# Patient Record
Sex: Male | Born: 1960
Health system: Southern US, Community
[De-identification: ages and names within clinical notes are randomized; demographics above are authoritative.]

## PROBLEM LIST (undated history)

## (undated) DIAGNOSIS — Z8249 Family history of ischemic heart disease and other diseases of the circulatory system: Secondary | ICD-10-CM

## (undated) DIAGNOSIS — E785 Hyperlipidemia, unspecified: Secondary | ICD-10-CM

## (undated) DIAGNOSIS — I10 Essential (primary) hypertension: Secondary | ICD-10-CM

## (undated) DIAGNOSIS — I5189 Other ill-defined heart diseases: Secondary | ICD-10-CM

## (undated) DIAGNOSIS — T7840XA Allergy, unspecified, initial encounter: Secondary | ICD-10-CM

## (undated) DIAGNOSIS — I2510 Atherosclerotic heart disease of native coronary artery without angina pectoris: Secondary | ICD-10-CM

## (undated) HISTORY — DX: Other ill-defined heart diseases: I51.89

## (undated) HISTORY — DX: Family history of ischemic heart disease and other diseases of the circulatory system: Z82.49

## (undated) HISTORY — DX: Essential (primary) hypertension: I10

## (undated) HISTORY — DX: Allergy, unspecified, initial encounter: T78.40XA

## (undated) HISTORY — DX: Atherosclerotic heart disease of native coronary artery without angina pectoris: I25.10

## (undated) HISTORY — PX: OTHER SURGICAL HISTORY: SHX169

## (undated) HISTORY — DX: Hyperlipidemia, unspecified: E78.5

---

## 2008-10-21 ENCOUNTER — Emergency Department: Payer: Self-pay | Admitting: Emergency Medicine

## 2013-06-18 ENCOUNTER — Emergency Department: Payer: Self-pay | Admitting: Emergency Medicine

## 2015-06-30 ENCOUNTER — Ambulatory Visit
Admission: RE | Admit: 2015-06-30 | Discharge: 2015-06-30 | Disposition: A | Discharge status: Home / Self Care | Payer: BLUE CROSS/BLUE SHIELD | Source: Ambulatory Visit | Attending: Family Medicine | Admitting: Family Medicine

## 2015-06-30 ENCOUNTER — Other Ambulatory Visit: Payer: Self-pay | Admitting: Family Medicine

## 2015-06-30 DIAGNOSIS — M25362 Other instability, left knee: Secondary | ICD-10-CM | POA: Diagnosis not present

## 2015-07-09 DIAGNOSIS — M25362 Other instability, left knee: Secondary | ICD-10-CM | POA: Diagnosis not present

## 2015-12-09 DIAGNOSIS — Z1211 Encounter for screening for malignant neoplasm of colon: Secondary | ICD-10-CM | POA: Diagnosis not present

## 2016-03-20 ENCOUNTER — Ambulatory Visit: Payer: Self-pay | Admitting: Medical

## 2016-03-20 ENCOUNTER — Encounter: Payer: Self-pay | Admitting: Medical

## 2016-03-20 DIAGNOSIS — J0110 Acute frontal sinusitis, unspecified: Secondary | ICD-10-CM

## 2016-03-20 DIAGNOSIS — J029 Acute pharyngitis, unspecified: Secondary | ICD-10-CM | POA: Insufficient documentation

## 2016-03-20 MED ORDER — AMOXICILLIN-POT CLAVULANATE 875-125 MG PO TABS: 1.0000 | ORAL_TABLET | Freq: Two times a day (BID) | ORAL | 0 refills | Status: DC

## 2016-03-20 NOTE — Progress Notes (Signed)
   Subjective:    Patient ID: Micheal Holland, male    DOB: [birth year], 56 y.o.   MRN: [id number]  HPI 56 yo black male presents with sore throat for one day and headache located on the left frontal side of his head started one hour ago. Patient took tylenol last night and two tylenol today at  [place]. Denies any bodyaches.    Review of Systems  Negative for fever or chills Negative for ear pain Some nasal drainage, clear Negative for cough Negative for shortness of breath or chest pain.     Objective:   Physical Exam  Constitutional: He is oriented to person, place, and time. He appears well-developed and well-nourished.  HENT:  Head: Normocephalic and atraumatic.  Nose: Mucosal edema present.  Mouth/Throat: Posterior oropharyngeal erythema present.  Eyes: Conjunctivae and EOM are normal. Pupils are equal, round, and reactive to light.  Neck: Neck supple.  Cardiovascular: Normal rate, regular rhythm and normal heart sounds.  Exam reveals no gallop and no friction rub.   No murmur heard. Pulmonary/Chest: Effort normal and breath sounds normal.  Lymphadenopathy:    He has no cervical adenopathy.  Neurological: He is alert and oriented to person, place, and time.  Skin: Skin is warm and dry.  Psychiatric: He has a normal mood and affect. His behavior is normal.  Patent airway  Right tm obscured due to cerumen impaction Left tm dull Left turbinate edema and erythema         Assessment & Plan:  Sinusitis frontal treated with Augmentin 875mg  one twice daily x 10 days take with food. Pharyngitis dilute salt water gargles 3-4 times/day Rest increase fluids, avoid acidic food. Can use ibuprofen in between tylenol dosage to help with headache and sore throat pain. Given 3 packets of ibuprofen in clinic lot [id number] exp 09/2017 Return to the clinic in 3-5 days if not improving.

## 2016-04-03 ENCOUNTER — Ambulatory Visit: Payer: BLUE CROSS/BLUE SHIELD | Admitting: Certified Registered Nurse Anesthetist

## 2016-04-03 ENCOUNTER — Encounter: Payer: Self-pay | Admitting: Anesthesiology

## 2016-04-03 ENCOUNTER — Encounter
Admission: RE | Disposition: A | Discharge status: Home / Self Care | Payer: Self-pay | Source: Ambulatory Visit | Attending: Gastroenterology

## 2016-04-03 ENCOUNTER — Ambulatory Visit
Admission: RE | Admit: 2016-04-03 | Discharge: 2016-04-03 | Disposition: A | Discharge status: Home / Self Care | Payer: BLUE CROSS/BLUE SHIELD | Source: Ambulatory Visit | Attending: Gastroenterology | Admitting: Gastroenterology

## 2016-04-03 DIAGNOSIS — D122 Benign neoplasm of ascending colon: Secondary | ICD-10-CM | POA: Insufficient documentation

## 2016-04-03 DIAGNOSIS — Z91018 Allergy to other foods: Secondary | ICD-10-CM | POA: Insufficient documentation

## 2016-04-03 DIAGNOSIS — K635 Polyp of colon: Secondary | ICD-10-CM | POA: Diagnosis not present

## 2016-04-03 DIAGNOSIS — Z1211 Encounter for screening for malignant neoplasm of colon: Secondary | ICD-10-CM | POA: Insufficient documentation

## 2016-04-03 HISTORY — PX: COLONOSCOPY WITH PROPOFOL: SHX5780

## 2016-04-03 SURGERY — COLONOSCOPY WITH PROPOFOL
Anesthesia: General

## 2016-04-03 MED ORDER — PROPOFOL 10 MG/ML IV BOLUS
INTRAVENOUS | Status: AC
  Filled 2016-04-03: qty 20

## 2016-04-03 MED ORDER — LIDOCAINE HCL (PF) 2 % IJ SOLN
INTRAMUSCULAR | Status: AC
  Filled 2016-04-03: qty 2

## 2016-04-03 MED ORDER — PROPOFOL 500 MG/50ML IV EMUL
INTRAVENOUS | Status: AC
  Filled 2016-04-03: qty 50

## 2016-04-03 MED ORDER — LIDOCAINE HCL (CARDIAC) 20 MG/ML IV SOLN
INTRAVENOUS | Status: DC | PRN
  Administered 2016-04-03: 100 mg via INTRAVENOUS

## 2016-04-03 MED ORDER — SODIUM CHLORIDE 0.9 % IV SOLN: INTRAVENOUS | Status: DC

## 2016-04-03 MED ORDER — PROPOFOL 500 MG/50ML IV EMUL
INTRAVENOUS | Status: DC | PRN
  Administered 2016-04-03: 150 ug/kg/min via INTRAVENOUS

## 2016-04-03 MED ORDER — PROPOFOL 10 MG/ML IV BOLUS
INTRAVENOUS | Status: DC | PRN
  Administered 2016-04-03: 70 mg via INTRAVENOUS

## 2016-04-03 MED ORDER — SODIUM CHLORIDE 0.9 % IV SOLN
INTRAVENOUS | Status: DC
  Administered 2016-04-03: 12:00:00 via INTRAVENOUS

## 2016-04-03 NOTE — Anesthesia Procedure Notes (Signed)
Performed by: Saanvika Vazques Pre-anesthesia Checklist: Patient identified, Emergency Drugs available, Suction available, Patient being monitored and Timeout performed Patient Re-evaluated:Patient Re-evaluated prior to inductionOxygen Delivery Method: Nasal cannula Intubation Type: IV induction       

## 2016-04-03 NOTE — H&P (Signed)
Outpatient short stay form Pre-procedure 04/03/2016 11:50 AM Micheal Sails MD  Primary Physician: Clinton Quant PA  Reason for visit:  Colonoscopy  History of present illness:  Patient is a 56 year old male presenting today as above. He tolerated his prep well. He takes no aspirin or blood thinning agents. This is his first colonoscopy.    Current Facility-Administered Medications:  .  0.9 %  sodium chloride infusion, , Intravenous, Continuous, Micheal Sails, MD .  0.9 %  sodium chloride infusion, , Intravenous, Continuous, Micheal Sails, MD  Prescriptions Prior to Admission  Medication Sig Dispense Refill Last Dose  . amoxicillin-clavulanate (AUGMENTIN) 875-125 MG tablet Take 1 tablet by mouth 2 (two) times daily. Take food (Patient not taking: Reported on 04/03/2016) 20 tablet 0 Completed Course at Unknown time     No Known Allergies   No past medical history on file.  Review of systems:      Physical Exam    Heart and lungs: Regular rate and rhythm without rub or gallop, lungs are bilaterally clear.    HEENT: Normocephalic atraumatic eyes are anicteric    Other:     Pertinant exam for procedure: Soft nontender nondistended bowel sounds positive normoactive.    Planned proceedures: Colonoscopy and indicated procedures. I have discussed the risks benefits and complications of procedures to include not limited to bleeding, infection, perforation and the risk of sedation and the patient wishes to proceed.    Micheal Sails, MD Gastroenterology 04/03/2016  11:50 AM

## 2016-04-03 NOTE — Transfer of Care (Signed)
Immediate Anesthesia Transfer of Care Note  Patient: Micheal Holland  Procedure(s) Performed: Procedure(s): COLONOSCOPY WITH PROPOFOL (N/A)  Patient Location: PACU  Anesthesia Type:General  Level of Consciousness: sedated  Airway & Oxygen Therapy: Patient Spontanous Breathing and Patient connected to nasal cannula oxygen  Post-op Assessment: Report given to RN and Post -op Vital signs reviewed and stable  Post vital signs: Reviewed and stable  Last Vitals:  Vitals:   04/03/16 1147 04/03/16 1355  BP: (!) 149/95 110/63  Pulse: 97   Resp: 16   Temp: (!) 35.6 C (!) 35.7 C    Last Pain:  Vitals:   04/03/16 1355  TempSrc: Tympanic         Complications: No apparent anesthesia complications

## 2016-04-03 NOTE — Anesthesia Post-op Follow-up Note (Cosign Needed)
Anesthesia QCDR form completed.        

## 2016-04-03 NOTE — Op Note (Signed)
[facility] Gastroenterology Patient Name: Micheal Holland Procedure Date: 04/03/2016 1:18 PM MRN: [id number] Account #: [id number] Date of Birth: Nov 24, 1960 Admit Type: Outpatient Age: 56 Room: [facility] ENDO ROOM 3 Gender: Male Note Status: Finalized Procedure:            Colonoscopy Indications:          Screening for colorectal malignant neoplasm Providers:            Lollie Sails, MD Referring MD:         Estill Dooms. Lily Peer, MD (Referring MD) Medicines:            Monitored Anesthesia Care Complications:        No immediate complications. Procedure:            Pre-Anesthesia Assessment:                       - ASA Grade Assessment: I - A normal, healthy patient.                       After obtaining informed consent, the colonoscope was                        passed under direct vision. Throughout the procedure,                        the patient's blood pressure, pulse, and oxygen                        saturations were monitored continuously. The                        Colonoscope was introduced through the anus and                        advanced to the the cecum, identified by appendiceal                        orifice and ileocecal valve. The colonoscopy was                        performed without difficulty. The patient tolerated the                        procedure well. The quality of the bowel preparation                        was good. Findings:      A 3 mm polyp was found in the ascending colon. The polyp was flat. The       polyp was removed with a cold biopsy forceps. Resection and retrieval       were complete.      The retroflexed view of the distal rectum and anal verge was normal and       showed no anal or rectal abnormalities.      The digital rectal exam was normal.      The exam was otherwise normal throughout the examined colon. Impression:           - One 3 mm polyp in the ascending colon, removed with a   cold biopsy forceps. Resected and retrieved.                       -  The distal rectum and anal verge are normal on                        retroflexion view. Recommendation:       - Discharge patient to home.                       - Advance diet as tolerated. Procedure Code(s):    --- Professional ---                       [phone], Colonoscopy, flexible; with biopsy, single or                        multiple Diagnosis Code(s):    --- Professional ---                       Z12.11, Encounter for screening for malignant neoplasm                        of colon                       D12.2, Benign neoplasm of ascending colon CPT copyright 2016 American Medical Association. All rights reserved. The codes documented in this report are preliminary and upon coder review may  be revised to meet current compliance requirements. Lollie Sails, MD 04/03/2016 1:52:17 PM This report has been signed electronically. Number of Addenda: 0 Note Initiated On: 04/03/2016 1:18 PM Scope Withdrawal Time: 0 hours 8 minutes 45 seconds  Total Procedure Duration: 0 hours 19 minutes 8 seconds       [facility]

## 2016-04-03 NOTE — Anesthesia Preprocedure Evaluation (Signed)
Anesthesia Evaluation  Patient identified by MRN, date of birth, ID band Patient awake    Reviewed: Allergy & Precautions, H&P , NPO status , Patient's Chart, lab work & pertinent test results, reviewed documented beta blocker date and time   History of Anesthesia Complications Negative for: history of anesthetic complications  Airway Mallampati: III  TM Distance: >3 FB Neck ROM: full    Dental  (+) Chipped, Teeth Intact   Pulmonary neg pulmonary ROS,           Cardiovascular Exercise Tolerance: Good negative cardio ROS       Neuro/Psych negative neurological ROS  negative psych ROS   GI/Hepatic negative GI ROS, Neg liver ROS,   Endo/Other  negative endocrine ROS  Renal/GU negative Renal ROS  negative genitourinary   Musculoskeletal   Abdominal   Peds  Hematology negative hematology ROS (+)   Anesthesia Other Findings History reviewed. No pertinent past medical history.   Reproductive/Obstetrics negative OB ROS                             Anesthesia Physical Anesthesia Plan  ASA: I  Anesthesia Plan: General   Post-op Pain Management:    Induction:   Airway Management Planned:   Additional Equipment:   Intra-op Plan:   Post-operative Plan:   Informed Consent: I have reviewed the patients History and Physical, chart, labs and discussed the procedure including the risks, benefits and alternatives for the proposed anesthesia with the patient or authorized representative who has indicated his/her understanding and acceptance.   Dental Advisory Given  Plan Discussed with: Anesthesiologist, CRNA and Surgeon  Anesthesia Plan Comments:         Anesthesia Quick Evaluation

## 2016-04-03 NOTE — Anesthesia Postprocedure Evaluation (Signed)
Anesthesia Post Note  Patient: Micheal Holland  Procedure(s) Performed: Procedure(s) (LRB): COLONOSCOPY WITH PROPOFOL (N/A)  Patient location during evaluation: Endoscopy Anesthesia Type: General Level of consciousness: awake and alert Pain management: pain level controlled Vital Signs Assessment: post-procedure vital signs reviewed and stable Respiratory status: spontaneous breathing, nonlabored ventilation, respiratory function stable and patient connected to nasal cannula oxygen Cardiovascular status: blood pressure returned to baseline and stable Postop Assessment: no signs of nausea or vomiting Anesthetic complications: no     Last Vitals:  Vitals:   04/03/16 1355 04/03/16 1415  BP: 110/63 (!) 145/98  Pulse:    Resp:    Temp: (!) 35.7 C     Last Pain:  Vitals:   04/03/16 1355  TempSrc: Tympanic                 Martha Clan

## 2016-04-04 ENCOUNTER — Encounter: Payer: Self-pay | Admitting: Gastroenterology

## 2016-04-04 LAB — SURGICAL PATHOLOGY

## 2016-04-13 ENCOUNTER — Encounter: Payer: Self-pay | Admitting: Medical

## 2016-04-13 ENCOUNTER — Ambulatory Visit: Payer: Self-pay | Admitting: Medical

## 2016-04-13 VITALS — BP 116/86 | HR 96 | Temp 98.7°F | Resp 16 | Ht 74.5 in | Wt 216.0 lb

## 2016-04-13 DIAGNOSIS — H6501 Acute serous otitis media, right ear: Secondary | ICD-10-CM

## 2016-04-13 MED ORDER — AZITHROMYCIN 250 MG PO TABS: ORAL_TABLET | ORAL | 0 refills | Status: DC

## 2016-04-13 NOTE — Progress Notes (Signed)
   Subjective:    Patient ID: Micheal Holland, male    DOB: 1960/09/17, 56 y.o.   MRN: [id number]  HPI 56 yo male with right ear pain starting yesterday. Pain is  6-7/10. Cold air increases pain, so wearing a hat. No complaints of cough, runny nose or fever. Treated 3/12 for sinusitis with Augmentin. Those symptoms have resolved.    Review of Systems  Constitutional: Negative for chills and fever.  HENT: Negative for congestion, sinus pain and sore throat.   Respiratory: Negative for cough.        Objective:   Physical Exam  Constitutional: He appears well-developed and well-nourished.  HENT:  Head: Normocephalic and atraumatic.  Right Ear: Hearing, external ear and ear canal normal. Tympanic membrane is erythematous.  Left Ear: Hearing, external ear and ear canal normal. A middle ear effusion is present.  Mouth/Throat: Oropharynx is clear and moist.  Eyes: Conjunctivae and EOM are normal. Pupils are equal, round, and reactive to light.  Cardiovascular: Normal rate and regular rhythm.  Exam reveals no gallop and no friction rub.   No murmur heard. Pulmonary/Chest: Effort normal and breath sounds normal.  Lymphadenopathy:    He has no cervical adenopathy.  Nursing note and vitals reviewed.         Assessment & Plan:  Right otitis media, recently treated for sinusitis with Augmenting so prescribed Z-pak 250 mg  Take 2 tablets by mouth today then one tablet days  2 thru 5. Take with food  #6 no refill. Given ibuprofen 200mg  to take  4 tablets after eating lunch next dose in 8 hours. Lot [id number], exp 09/2017. Return to clinic on Monday if pain not resolving.

## 2016-06-27 ENCOUNTER — Encounter: Payer: Self-pay | Admitting: Medical

## 2016-06-27 ENCOUNTER — Ambulatory Visit: Payer: Self-pay | Admitting: Medical

## 2016-06-27 VITALS — HR 110 | Temp 98.5°F | Resp 16 | Ht 74.0 in | Wt 214.0 lb

## 2016-06-27 DIAGNOSIS — H9201 Otalgia, right ear: Secondary | ICD-10-CM

## 2016-06-27 DIAGNOSIS — H6121 Impacted cerumen, right ear: Secondary | ICD-10-CM

## 2016-06-27 DIAGNOSIS — J069 Acute upper respiratory infection, unspecified: Secondary | ICD-10-CM

## 2016-06-27 DIAGNOSIS — J0100 Acute maxillary sinusitis, unspecified: Secondary | ICD-10-CM

## 2016-06-27 MED ORDER — AMOXICILLIN 875 MG PO TABS
875.0000 mg | ORAL_TABLET | Freq: Two times a day (BID) | ORAL | 0 refills | Status: DC
Start: 2016-06-27 — End: 2016-09-11

## 2016-06-27 NOTE — Progress Notes (Signed)
   Subjective:    Patient ID: Micheal Holland, male    DOB: [birth year], 56 y.o.   MRN: [id number]  HPI Right ear pain starting about 11pm last night . Has had a head cold for 2-3 week. Nasal congestion and discharge is yellow.  Taking no over the counter medication. 4/10 pain rating for right ear. Cough productive yellow.  Review of Systems  Constitutional: Negative for chills and fever.  HENT: Positive for congestion, postnasal drip and rhinorrhea. Negative for sinus pain, sinus pressure, sore throat and tinnitus.   Eyes: Negative for discharge.  Respiratory: Positive for cough. Negative for shortness of breath and wheezing.   Cardiovascular: Negative for chest pain.  Gastrointestinal: Negative for abdominal pain.  Endocrine: Negative for cold intolerance and heat intolerance.  Genitourinary: Negative for hematuria.  Musculoskeletal: Negative for back pain and myalgias.  Skin: Negative for rash.  Allergic/Immunologic: Positive for environmental allergies and food allergies.  Neurological: Positive for light-headedness. Negative for dizziness and syncope.  Hematological: Positive for adenopathy.  Psychiatric/Behavioral: Negative for confusion. The patient is not nervous/anxious.        Objective:   Physical Exam  Constitutional: He is oriented to person, place, and time. He appears well-developed and well-nourished.  HENT:  Head: Normocephalic and atraumatic.  Right Ear: External ear normal.  Left Ear: External ear normal.  Mouth/Throat: Oropharynx is clear and moist.  Eyes: Conjunctivae and EOM are normal. Pupils are equal, round, and reactive to light.  Neck: Normal range of motion. Neck supple.  Cardiovascular: Normal rate, regular rhythm and normal heart sounds.   Pulmonary/Chest: Effort normal and breath sounds normal.  Neurological: He is alert and oriented to person, place, and time.  Skin: Skin is warm and dry.  Psychiatric: He has a normal mood and affect. His  behavior is normal. Judgment and thought content normal.  Nursing note and vitals reviewed.  Ear wax appears soft on the right ear canal, can see about  1/4 of the TM.   HR rehceck  98    Assessment & Plan:  Sinusitis and upper respirtorry infection, Otalgia e-prescirbed Amoxil 875 mg one by mouth twice daily with food   For 10 days #20 no refills. OTC Delsym cough medication , take as directed. OTC Ibuprofen or Tylenol take as directed for pain. Return to the clinic in  3-5 days if not improving.

## 2016-09-11 ENCOUNTER — Ambulatory Visit: Payer: Self-pay | Admitting: Medical

## 2016-09-11 ENCOUNTER — Encounter: Payer: Self-pay | Admitting: Medical

## 2016-09-11 VITALS — BP 128/80 | HR 93 | Temp 97.0°F | Resp 18 | Ht 74.0 in | Wt 220.0 lb

## 2016-09-11 DIAGNOSIS — S63502A Unspecified sprain of left wrist, initial encounter: Secondary | ICD-10-CM

## 2016-09-11 DIAGNOSIS — M79602 Pain in left arm: Secondary | ICD-10-CM

## 2016-09-11 NOTE — Patient Instructions (Signed)
Wrist Sprain, Adult A wrist sprain is a stretch or tear in the strong, fibrous tissues (ligaments) that connect your wrist bones. There are three types of wrist sprains:  Grade 1. In this type of sprain, the ligament is stretched more than normal.  Grade 2. In this type of sprain, the ligament is partially torn. You may be able to move your wrist, but not very much.  Grade 3. In this type of sprain, the ligament or muscle is completely torn. You may find it difficult or extremely painful to move your wrist even a little.  What are the causes? A wrist sprain can be caused by using the wrist too much during sports, exercise, or at work. It can also happen with a fall or during an accident. What increases the risk? This condition is more likely to occur in people:  With a previous wrist or arm injury.  With poor wrist strength and flexibility.  Who play contact sports, such as football or soccer.  Who play sports that may result in a fall, such as skateboarding, biking, skiing, or snowboarding.  Who do not exercise regularly.  Who use exercise equipment that does not fit well.  What are the signs or symptoms? Symptoms of this condition include:  Pain in the wrist, arm, or hand.  Swelling or bruised skin near the wrist, hand, or arm. The skin may look yellow or kind of blue.  Stiffness or trouble moving the hand.  Hearing a pop or feeling a tear at the time of the injury.  A warm feeling in the skin around the wrist.  How is this diagnosed? This condition is diagnosed with a physical exam. Sometimes an X-ray is taken to make sure a bone did not break. If your health care provider thinks that you tore a ligament, he or she may order an MRI of your wrist. How is this treated? This condition is treated by resting and applying ice to your wrist. Additional treatment may include:  Medicine for pain and inflammation.  A splint to keep your wrist still (immobilized).  Exercises  to strengthen and stretch your wrist.  Surgery. This may be done if the ligament is completely torn.  Follow these instructions at home: If you have a splint:   Do not put pressure on any part of the splint until it is fully hardened. This may take several hours.  Wear the splint as told by your health care provider. Remove it only as told by your health care provider.  Loosen the splint if your fingers tingle, become numb, or turn cold and blue.  If your splint is not waterproof: ? Do not let it get wet. ? Cover it with a watertight covering when you take a bath or a shower.  Keep the splint clean. Managing pain, stiffness, and swelling   If directed, put ice on the injured area. ? If you have a removable splint, remove it as told by your health care provider. ? Put ice in a plastic bag. ? Place a towel between your skin and the bag or between the splint and the bag. ? Leave the ice on for 20 minutes, 2-3 times per day.  Move your fingers often to avoid stiffness and to lessen swelling.  Raise (elevate) the injured area above the level of your heart while you are sitting or lying down. Activity  Rest your wrist. Do not do things that cause pain.  Return to your normal activities as told by   your health care provider. Ask your health care provider what activities are safe for you.  Do exercises as told by your health care provider. General instructions  Take over-the-counter and prescription medicines only as told by your health care provider.  Do not use any products that contain nicotine or tobacco, such as cigarettes and e-cigarettes. These can delay healing. If you need help quitting, ask your health care provider.  Ask your health care provider when it is safe to drive if you have a splint.  Keep all follow-up visits as told by your health care provider. This is important. Contact a health care provider if:  Your pain, bruising, or swelling gets worse.  Your  skin becomes red, gets a rash, or has open sores.  Your pain does not get better or it gets worse. Get help right away if:  You have a new or sudden sharp pain in the hand, arm, or wrist.  You have tingling or numbness in your hand.  Your fingers turn white, very red, or cold and blue.  You cannot move your fingers. This information is not intended to replace advice given to you by your health care provider. Make sure you discuss any questions you have with your health care provider. Document Released: 08/29/2013 Document Revised: 07/24/2015 Document Reviewed: 07/15/2015 Elsevier Interactive Patient Education  2017 [employer]  

## 2016-09-11 NOTE — Progress Notes (Signed)
   Subjective:    Patient ID: Micheal Holland, male    DOB: [birth year], 55 y.o.   MRN: [id number]  HPI  56 yo male comes into today for blood pressure recheck.  States left arm started on the way to work with aching like a toothache in the lower arm and wrist area, he says he  feels weak, denies numbness or tingling. Felt nauseous this morning , ate something and felt better. Nausea is better now. Denies chest pain, or shortness of breath, or dizziness. Feels low in energy.  Review of Systems  Constitutional: Positive for activity change and fatigue. Negative for fever and unexpected weight change.  HENT: Positive for congestion. Negative for ear discharge, ear pain, hearing loss, nosebleeds, sore throat, trouble swallowing and voice change.   Eyes: Negative for photophobia, pain, discharge, redness, itching and visual disturbance.  Respiratory: Negative for cough, choking, chest tightness, shortness of breath and wheezing.   Cardiovascular: Negative for chest pain, palpitations and leg swelling.  Gastrointestinal: Positive for nausea. Negative for abdominal pain, blood in stool and diarrhea.  Endocrine: Negative.  Negative for cold intolerance, heat intolerance, polydipsia, polyphagia and polyuria.  Genitourinary: Negative for difficulty urinating and dysuria.  Musculoskeletal: Negative.   Skin: Negative.   Allergic/Immunologic: Positive for environmental allergies.  Neurological: Positive for weakness. Negative for dizziness, tremors, seizures, syncope, facial asymmetry, speech difficulty, light-headedness, numbness and headaches.  Hematological: Negative.   Psychiatric/Behavioral: Negative.   Nausea resolved after eating something this am. Feels like his wrist is weak. On physical exam grip is equal bilaterally , abduction and adduction , internal and external rotation equal bilaterally of upper extremities. FROM  2+ brachial and radial pulses. Able to lift arms over head.  Gait wnl.    Objective:   Physical Exam  Constitutional: He is oriented to person, place, and time. He appears well-developed and well-nourished.  HENT:  Head: Normocephalic and atraumatic.  Eyes: Pupils are equal, round, and reactive to light. Conjunctivae and EOM are normal.  Neck: Normal range of motion. Neck supple.  Cardiovascular: Normal rate and regular rhythm.  Exam reveals no gallop and no friction rub.   No murmur heard. Pulmonary/Chest: Effort normal and breath sounds normal.  Musculoskeletal: Normal range of motion.  Neurological: He is alert and oriented to person, place, and time.  Skin: Skin is warm and dry.  Psychiatric: He has a normal mood and affect. His behavior is normal. Judgment and thought content normal.  Nursing note and vitals reviewed.    EKG sinus rhythm  78 bpm.     Assessment & Plan:  Blood pressure recheck. Left arm pain (lower and wrist area) , ,most likely sprain.. 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 OTC Ibuprofen 400 mg by mouth , take as directed. ICE , return to clinic in  3-5 days if not improving.

## 2016-09-29 ENCOUNTER — Other Ambulatory Visit: Payer: Self-pay

## 2016-10-10 ENCOUNTER — Other Ambulatory Visit: Payer: Self-pay

## 2016-10-10 DIAGNOSIS — Z0000 Encounter for general adult medical examination without abnormal findings: Secondary | ICD-10-CM

## 2016-10-11 LAB — CMP12+LP+TP+TSH+6AC+PSA+CBC…
ALT: 26 IU/L (ref 0–44)
AST: 32 IU/L (ref 0–40)
Albumin/Globulin Ratio: 2.4 — ABNORMAL HIGH (ref 1.2–2.2)
Albumin: 4.8 g/dL (ref 3.5–5.5)
Alkaline Phosphatase: 54 IU/L (ref 39–117)
BUN/Creatinine Ratio: 11 (ref 9–20)
BUN: 13 mg/dL (ref 6–24)
Basophils Absolute: 0.1 10*3/uL (ref 0.0–0.2)
Basos: 1 %
Bilirubin Total: 0.6 mg/dL (ref 0.0–1.2)
Calcium: 9.6 mg/dL (ref 8.7–10.2)
Chloride: 103 mmol/L (ref 96–106)
Chol/HDL Ratio: 4.5 ratio (ref 0.0–5.0)
Cholesterol, Total: 285 mg/dL — ABNORMAL HIGH (ref 100–199)
Creatinine, Ser: 1.2 mg/dL (ref 0.76–1.27)
EOS (ABSOLUTE): 0.4 10*3/uL (ref 0.0–0.4)
Eos: 7 %
Estimated CHD Risk: 0.9 times avg. (ref 0.0–1.0)
Free Thyroxine Index: 1.9 (ref 1.2–4.9)
GFR calc Af Amer: 79 mL/min/{1.73_m2} (ref >59)
GFR calc non Af Amer: 68 mL/min/{1.73_m2} (ref >59)
GGT: 57 IU/L (ref 0–65)
Globulin, Total: 2 g/dL (ref 1.5–4.5)
Glucose: 108 mg/dL — ABNORMAL HIGH (ref 65–99)
HDL: 63 mg/dL (ref >39)
Hematocrit: 43.6 % (ref 37.5–51.0)
Hemoglobin: 14.3 g/dL (ref 13.0–17.7)
Immature Grans (Abs): 0 10*3/uL (ref 0.0–0.1)
Immature Granulocytes: 0 %
Iron: 94 ug/dL (ref 38–169)
LDH: 192 IU/L (ref 121–224)
LDL Calculated: 195 mg/dL — ABNORMAL HIGH (ref 0–99)
Lymphocytes Absolute: 1.4 10*3/uL (ref 0.7–3.1)
Lymphs: 30 %
MCH: 29.7 pg (ref 26.6–33.0)
MCHC: 32.8 g/dL (ref 31.5–35.7)
MCV: 91 fL (ref 79–97)
Monocytes Absolute: 0.5 10*3/uL (ref 0.1–0.9)
Monocytes: 10 %
Neutrophils Absolute: 2.4 10*3/uL (ref 1.4–7.0)
Neutrophils: 52 %
Phosphorus: 4.1 mg/dL (ref 2.5–4.5)
Platelets: 274 10*3/uL (ref 150–379)
Potassium: 4.5 mmol/L (ref 3.5–5.2)
Prostate Specific Ag, Serum: 0.3 ng/mL (ref 0.0–4.0)
RBC: 4.81 x10E6/uL (ref 4.14–5.80)
RDW: 15.3 % (ref 12.3–15.4)
Sodium: 142 mmol/L (ref 134–144)
T3 Uptake Ratio: 31 % (ref 24–39)
T4, Total: 6 ug/dL (ref 4.5–12.0)
TSH: 3.45 u[IU]/mL (ref 0.450–4.500)
Total Protein: 6.8 g/dL (ref 6.0–8.5)
Triglycerides: 136 mg/dL (ref 0–149)
Uric Acid: 8.2 mg/dL (ref 3.7–8.6)
VLDL Cholesterol Cal: 27 mg/dL (ref 5–40)
WBC: 4.8 10*3/uL (ref 3.4–10.8)

## 2016-10-11 LAB — VITAMIN D 25 HYDROXY (VIT D DEFICIENCY, FRACTURES): Vit D, 25-Hydroxy: 16.9 ng/mL — ABNORMAL LOW (ref 30.0–100.0)

## 2016-10-17 ENCOUNTER — Telehealth: Payer: Self-pay

## 2016-10-17 LAB — HGB A1C W/O EAG: Hgb A1c MFr Bld: 6.1 % — ABNORMAL HIGH (ref 4.8–5.6)

## 2016-10-17 LAB — SPECIMEN STATUS REPORT

## 2016-10-17 NOTE — Telephone Encounter (Signed)
Discussed A1c results and prediabetes . Will see Dietitian Oct. 16th at 10:00am for help with diet.

## 2016-10-19 ENCOUNTER — Ambulatory Visit: Payer: Self-pay | Admitting: Adult Health

## 2016-10-19 ENCOUNTER — Encounter: Payer: Self-pay | Admitting: Adult Health

## 2016-10-19 VITALS — BP 122/88 | HR 89 | Temp 98.0°F | Resp 16 | Wt 222.0 lb

## 2016-10-19 DIAGNOSIS — Z0000 Encounter for general adult medical examination without abnormal findings: Secondary | ICD-10-CM

## 2016-10-19 DIAGNOSIS — Z8669 Personal history of other diseases of the nervous system and sense organs: Secondary | ICD-10-CM | POA: Insufficient documentation

## 2016-10-19 DIAGNOSIS — E785 Hyperlipidemia, unspecified: Secondary | ICD-10-CM | POA: Insufficient documentation

## 2016-10-19 DIAGNOSIS — R7303 Prediabetes: Secondary | ICD-10-CM

## 2016-10-19 DIAGNOSIS — Z789 Other specified health status: Secondary | ICD-10-CM

## 2016-10-19 DIAGNOSIS — Z7289 Other problems related to lifestyle: Secondary | ICD-10-CM | POA: Insufficient documentation

## 2016-10-19 DIAGNOSIS — Z8241 Family history of sudden cardiac death: Secondary | ICD-10-CM | POA: Insufficient documentation

## 2016-10-19 DIAGNOSIS — E559 Vitamin D deficiency, unspecified: Secondary | ICD-10-CM | POA: Insufficient documentation

## 2016-10-19 LAB — POCT URINALYSIS DIPSTICK
Bilirubin, UA: NEGATIVE
Blood, UA: NEGATIVE
Glucose, UA: NEGATIVE
Ketones, UA: NEGATIVE
Leukocytes, UA: NEGATIVE
Nitrite, UA: NEGATIVE
Protein, UA: NEGATIVE
Spec Grav, UA: 1.01 (ref 1.010–1.025)
Urobilinogen, UA: 1 E.U./dL
pH, UA: 6 (ref 5.0–8.0)

## 2016-10-19 MED ORDER — VITAMIN D (ERGOCALCIFEROL) 1.25 MG (50000 UNIT) PO CAPS: 50000.0000 [IU] | ORAL_CAPSULE | ORAL | 8 refills | Status: DC

## 2016-10-19 MED ORDER — VITAMIN D (ERGOCALCIFEROL) 1.25 MG (50000 UNIT) PO CAPS: 50000.0000 [IU] | ORAL_CAPSULE | ORAL | 0 refills | Status: AC

## 2016-10-19 NOTE — Progress Notes (Addendum)
Subjective:     Patient ID: Micheal Holland, male   DOB: November 12, 1960, 56 y.o.   MRN: [id number]  HPI  Patient is 56 year old male in no acute distress who presents to the clinic for a annual exam and to establish primary care.   Blood pressure 122/88, pulse 89, temperature 98 F (36.7 C), temperature source Tympanic, resp. rate 16, weight 222 lb (100.7 kg), SpO2 99 %. B/P 122/70 Recheck  He denies any symptoms currently.She denies any fever,  rash, chest pain, shortness of breath, nausea, vomiting, or diarrhea   Family History  Problem Relation Age of Onset  . Hypertension Mother        heart disease died 72   . Heart disease Mother   . Heart disease Father        "hole  behind heart56 years old died from this "  . Hypertension Father   . Kidney disease Sister        one sister has had transplant kidney / one sister died with tumor was J. witness did not do surgery  . Heart disease Brother        heart disease "hole behind heart"   Patient reports a family  history of heart disease as above, his father died at age 86 year of " hole in his heart". Brother also reported to have " hole in heart" and is alive. Mother also died of " heart disease " [er patient at age 22 years old. He denies any cardiac history for himself. He has never been evaluated by cardiology per his report. He was seen on 09/11/16 for left arm pain this was documented as lower left arm pain and he reports this completely resolved since that visit.   Social History   Social History  . Marital status: Single    Spouse name: N/A  . Number of children: N/A  . Years of education: N/A   Occupational History  . Not on file.   Social History Main Topics  . Smoking status: Never Smoker  . Smokeless tobacco: Never Used  . Alcohol use 2.4 oz/week    4 Cans of beer per week     Comment: every day beer  . Drug use: No  . Sexual activity: Yes    Birth control/ protection: None   Other Topics Concern  . Not on  file   Social History Narrative  . No narrative on file   Tetanus he reports is current within last 5 years- he does not know the date it was given. He has not had recommended HIV and Hepatitis C screening per his report.  He does not want a flu shot.   Colonoscopy was completed in 2018 with [facility] per patient and this was normal per patient report. Dentist does not see regularly saw last two years ago.  [facility] was seen in 2017 and patient will schedule recheck for this year. He wears reading glasses only.  He denies any other symptoms or any complaints.   Review of Systems  Constitutional: Positive for fatigue (he reports " I always feel tired" - denies any recent change. He  reports he is able to work). Negative for activity change, appetite change, chills, diaphoresis, fever and unexpected weight change.  HENT: Negative for congestion, dental problem, drooling, ear discharge, ear pain, facial swelling, hearing loss, mouth sores, nosebleeds, postnasal drip, rhinorrhea, sinus pain, sinus pressure, sneezing, sore throat, tinnitus, trouble swallowing and voice change.  Eyes: Negative for photophobia, pain, discharge, redness, itching and visual disturbance.  Respiratory: Negative for apnea, cough, choking, chest tightness, shortness of breath, wheezing and stridor.   Cardiovascular: Negative for chest pain, palpitations and leg swelling.       He denies any shortness of breath with exertion. He denies any chest pain, leg swelling or palpitations. He reports he walks at work but he does not exercise.   Gastrointestinal: Negative for abdominal distention, abdominal pain, anal bleeding, blood in stool, constipation, diarrhea, nausea, rectal pain and vomiting.  Endocrine: Negative for cold intolerance, heat intolerance, polydipsia and polyphagia.  Genitourinary: Negative for decreased urine volume, difficulty urinating, discharge, dysuria, enuresis, flank pain, frequency,  genital sores, hematuria, penile pain, penile swelling, scrotal swelling, testicular pain and urgency.  Musculoskeletal: Negative for arthralgias, back pain, gait problem, joint swelling, myalgias, neck pain and neck stiffness.  Skin: Negative for color change, pallor, rash and wound.  Allergic/Immunologic: Positive for food allergies (fruit ). Negative for environmental allergies and immunocompromised state.  Neurological: Negative for dizziness, tremors, seizures, syncope, facial asymmetry, speech difficulty, weakness, light-headedness, numbness and headaches.  Hematological: Negative for adenopathy. Does not bruise/bleed easily.  Psychiatric/Behavioral: Negative for agitation, behavioral problems, confusion, decreased concentration, dysphoric mood, hallucinations, self-injury, sleep disturbance and suicidal ideas. The patient is not nervous/anxious and is not hyperactive.        Objective:   Physical Exam  Constitutional: He is oriented to person, place, and time. He appears well-developed and well-nourished. No distress.  HENT:  Head: Normocephalic and atraumatic.  Right Ear: External ear normal.  Left Ear: External ear normal.  Nose: Nose normal.  Mouth/Throat: Oropharynx is clear and moist. No oropharyngeal exudate.  Eyes: Pupils are equal, round, and reactive to light. Conjunctivae and EOM are normal. Right eye exhibits no discharge. Left eye exhibits no discharge. No scleral icterus.  Neck: Normal range of motion. Neck supple. No JVD present. No tracheal deviation present. No thyromegaly present.  Cardiovascular: Normal rate, regular rhythm, S1 normal, S2 normal, normal heart sounds and intact distal pulses.  Exam reveals no gallop, no distant heart sounds and no friction rub.   No murmur heard. Pulmonary/Chest: Effort normal and breath sounds normal. No stridor. No respiratory distress. He has no wheezes. He has no rales. He exhibits no tenderness.  Abdominal: Soft. Bowel sounds are  normal. He exhibits no distension and no mass. There is no tenderness. There is no rebound and no guarding.  Genitourinary:  Genitourinary Comments: Patient declined prostate exam, PSA was normal. Patient declined testicular exam- he reports he does self testicular exams monthly and denies any concerns.  Patient declines Rectal exam and declines  Hemoccult .  Patient was educated on the above routine exams and patient still declines exam and he verbalizes understanding. He can call office for an appointment should he desire exam or referral to male provider if preference. He verbalizes understanding   Musculoskeletal: Normal range of motion. He exhibits no edema, tenderness or deformity.  Lymphadenopathy:       Head (right side): No submental, no submandibular, no tonsillar, no preauricular, no posterior auricular and no occipital adenopathy present.       Head (left side): No submental, no submandibular, no tonsillar, no preauricular, no posterior auricular and no occipital adenopathy present.    He has no cervical adenopathy.       Right cervical: No superficial cervical, no deep cervical and no posterior cervical adenopathy present.      Left cervical: No  superficial cervical, no deep cervical and no posterior cervical adenopathy present.    He has no axillary adenopathy.       Right axillary: No lateral adenopathy present.       Left axillary: No pectoral and no lateral adenopathy present.      Right: No supraclavicular adenopathy present.       Left: No supraclavicular adenopathy present.  Neurological: He is alert and oriented to person, place, and time. He has normal strength and normal reflexes. He displays no atrophy, no tremor and normal reflexes. No cranial nerve deficit or sensory deficit. He exhibits normal muscle tone. He displays a negative Romberg sign. He displays no seizure activity. Coordination and gait normal. GCS eye subscore is 4. GCS verbal subscore is 5. GCS motor subscore  is 6.  Patient moves on and off of exam table and in room without difficulty. Gait is normal in hall and in room. Patient is oriented to person place time and situation. Patient answers questions appropriately and engages in conversation.  Skin: Skin is warm and dry. No rash noted. He is not diaphoretic. No erythema. No pallor.  Exam limited as patient declined to undress and gown.   Psychiatric: He has a normal mood and affect. His behavior is normal. Judgment and thought content normal.  Nursing note reviewed.  EKG Normal reviewed and reviewed with supervising MD Rosanna Randy.  Recent Results (from the past 2160 hour(s))  CMP12+LP+TP+TSH+6AC+PSA+CBC.     Status: Abnormal   Collection Time: 10/10/16  8:33 AM  Result Value Ref Range   Glucose 108 (H) 65 - 99 mg/dL   Uric Acid 8.2 3.7 - 8.6 mg/dL    Comment:            Therapeutic target for gout patients: <6.0   BUN 13 6 - 24 mg/dL   Creatinine, Ser 1.20 0.76 - 1.27 mg/dL   GFR calc non Af Amer 68 >59 mL/min/1.73   GFR calc Af Amer 79 >59 mL/min/1.73   BUN/Creatinine Ratio 11 9 - 20   Sodium 142 134 - 144 mmol/L   Potassium 4.5 3.5 - 5.2 mmol/L   Chloride 103 96 - 106 mmol/L   Calcium 9.6 8.7 - 10.2 mg/dL   Phosphorus 4.1 2.5 - 4.5 mg/dL   Total Protein 6.8 6.0 - 8.5 g/dL   Albumin 4.8 3.5 - 5.5 g/dL   Globulin, Total 2.0 1.5 - 4.5 g/dL   Albumin/Globulin Ratio 2.4 (H) 1.2 - 2.2   Bilirubin Total 0.6 0.0 - 1.2 mg/dL   Alkaline Phosphatase 54 39 - 117 IU/L   LDH 192 121 - 224 IU/L   AST 32 0 - 40 IU/L   ALT 26 0 - 44 IU/L   GGT 57 0 - 65 IU/L   Iron 94 38 - 169 ug/dL   Cholesterol, Total 285 (H) 100 - 199 mg/dL   Triglycerides 136 0 - 149 mg/dL   HDL 63 >39 mg/dL   VLDL Cholesterol Cal 27 5 - 40 mg/dL   LDL Calculated 195 (H) 0 - 99 mg/dL   Chol/HDL Ratio 4.5 0.0 - 5.0 ratio    Comment:                                   T. Chol/HDL Ratio  Men  Women                               1/2  Avg.Risk  3.4    3.3                                   Avg.Risk  5.0    4.4                                2X Avg.Risk  9.6    7.1                                3X Avg.Risk 23.4   11.0    Estimated CHD Risk 0.9 0.0 - 1.0 times avg.    Comment:                                   T. Chol/HDL Ratio                                             Men  Women                               1/2 Avg.Risk  3.4    3.3                                   Avg.Risk  5.0    4.4                                2X Avg.Risk  9.6    7.1                                3X Avg.Risk 23.4   11.0 The CHD Risk is based on the T. Chol/HDL ratio.  Other factors affect CHD Risk such as hypertension, smoking, diabetes, severe obesity, and family history of pre- mature CHD.    TSH 3.450 0.450 - 4.500 uIU/mL   T4, Total 6.0 4.5 - 12.0 ug/dL   T3 Uptake Ratio 31 24 - 39 %   Free Thyroxine Index 1.9 1.2 - 4.9   Prostate Specific Ag, Serum 0.3 0.0 - 4.0 ng/mL    Comment: Roche ECLIA methodology. According to the American Urological Association, Serum PSA should decrease and remain at undetectable levels after radical prostatectomy. The AUA defines biochemical recurrence as an initial PSA value 0.2 ng/mL or greater followed by a subsequent confirmatory PSA value 0.2 ng/mL or greater. Values obtained with different assay methods or kits cannot be used interchangeably. Results cannot be interpreted as absolute evidence of the presence or absence of malignant disease.    WBC 4.8 3.4 - 10.8 x10E3/uL   RBC 4.81 4.14 - 5.80 x10E6/uL   Hemoglobin 14.3 13.0 - 17.7 g/dL   Hematocrit 43.6 37.5 -  51.0 %   MCV 91 79 - 97 fL   MCH 29.7 26.6 - 33.0 pg   MCHC 32.8 31.5 - 35.7 g/dL   RDW 15.3 12.3 - 15.4 %   Platelets 274 150 - 379 x10E3/uL   Neutrophils 52 Not Estab. %   Lymphs 30 Not Estab. %   Monocytes 10 Not Estab. %   Eos 7 Not Estab. %   Basos 1 Not Estab. %   Neutrophils Absolute 2.4 1.4 - 7.0 x10E3/uL   Lymphocytes  Absolute 1.4 0.7 - 3.1 x10E3/uL   Monocytes Absolute 0.5 0.1 - 0.9 x10E3/uL   EOS (ABSOLUTE) 0.4 0.0 - 0.4 x10E3/uL   Basophils Absolute 0.1 0.0 - 0.2 x10E3/uL   Immature Granulocytes 0 Not Estab. %   Immature Grans (Abs) 0.0 0.0 - 0.1 x10E3/uL  VITAMIN D 25 Hydroxy (Vit-D Deficiency, Fractures)     Status: Abnormal   Collection Time: 10/10/16  8:33 AM  Result Value Ref Range   Vit D, 25-Hydroxy 16.9 (L) 30.0 - 100.0 ng/mL    Comment: Vitamin D deficiency has been defined by the Institute of Medicine and an Endocrine Society practice guideline as a level of serum 25-OH vitamin D less than 20 ng/mL (1,2). The Endocrine Society went on to further define vitamin D insufficiency as a level between 21 and 29 ng/mL (2). 1. IOM (Institute of Medicine). 2010. Dietary reference    intakes for calcium and D. [place]: The    [employer]. 2. Holick MF, Binkley [place], Bischoff-Ferrari HA, et al.    Evaluation, treatment, and prevention of vitamin D    deficiency: an Endocrine Society clinical practice    guideline. JCEM. 2011 Jul; 96(7):1911-30.   Hgb A1c w/o eAG     Status: Abnormal   Collection Time: 10/10/16  8:33 AM  Result Value Ref Range   Hgb A1c MFr Bld 6.1 (H) 4.8 - 5.6 %    Comment:          Prediabetes: 5.7 - 6.4          Diabetes: >6.4          Glycemic control for adults with diabetes: <7.0   Specimen status report     Status: None   Collection Time: 10/10/16  8:33 AM  Result Value Ref Range   specimen status report Comment     Comment: Written Authorization Written Authorization Written Authorization Received. Authorization received from [place] 10-17-2016 Logged by Lenice Llamas   POCT urinalysis dipstick     Status: Normal   Collection Time: 10/19/16 11:58 AM  Result Value Ref Range   Color, UA yellow    Clarity, UA clear    Glucose, UA neg    Bilirubin, UA neg    Ketones, UA neg    Spec Grav, UA 1.010 1.010 - 1.025   Blood, UA neg    pH, UA  6.0 5.0 - 8.0   Protein, UA neg    Urobilinogen, UA 1.0 0.2 or 1.0 E.U./dL   Nitrite, UA neg    Leukocytes, UA Negative Negative      Assessment:     Routine physical examination - Plan: HIV antibody, Hepatitis c antibody (reflex), POCT urinalysis dipstick, EKG 12-Lead  Family history of death due to heart problem at 48 years of age or younger - Plan: Ambulatory referral to Cardiology  Hyperlipidemia, unspecified hyperlipidemia type - Plan: Ambulatory referral to Cardiology, Comprehensive metabolic panel, Lipid panel  Prediabetes - Plan: HgB  A1c  Alcohol use - Plan: Comprehensive metabolic panel  Vitamin D deficiency - Plan: VITAMIN D 25 Hydroxy (Vit-D Deficiency, Fractures), CBC w/Diff  History of farsightedness      Plan:       1. Patient labs were reviewed with him and discussed pre diabetes and hyperlipidemia. He has  Appointment with Nutritionist on 10/24/16 to gain a diet plan to help with pre diabetes and hyperlipidemia.  He will keep this appointment. Discussed options of medications for the above and risks versus benefits. Patient prefers to try diet and exercise before trying any medication.Will recheck levels in 4 months and have office visit to discuss progress or need for medication.   2. Vitamin D deficiency. Will prescribe Vitamin D per guidelines and recheck in 10 weeks. He will take prescription vitamin D for 8 weeks as prescribed and then change to Vitamin D 3 (2,000 IU ) daily and will recheck vitamin d level in 10 weeks.  3. Family history of cardiac disease and anomalies in many 1st degree relatives. Pre diabetic, alcohol use, hyperlipidemia.  Patient is asymptomatic currently other than "fatigue". EKG normal in office today. Discussed with supervising physician and will refer to cardiology for evaluation by cardiologist and possible need for cardiac CT or Echo and any other further evaluation as needed.  Referal for Cardiology was placed today.   4.. Patient  educated to see an  eye doctor regularly, keep next colonoscopy appointment, see dentist twice yearly for dental cleanings.  I have already ordered this as a future order in the computer.  Orders Placed This Encounter  Procedures  . HIV antibody  . Hepatitis c antibody (reflex)  . VITAMIN D 25 Hydroxy (Vit-D Deficiency, Fractures)    Vitamin d only at 10 week recheck from physical date 10/19/16    Standing Status:   Future    Standing Expiration Date:   12/19/2016  . CBC w/Diff    4 month recheck from 10/2016    Standing Status:   Future    Standing Expiration Date:   04/19/2017  . Comprehensive metabolic panel    Recheck 4 months from 10/2016    Standing Status:   Future    Standing Expiration Date:   04/19/2017  . Lipid panel    Recheck 4 months from 10/2016    Standing Status:   Future    Standing Expiration Date:   04/19/2017  . HgB A1c    4 month recheck from 10/2016    Standing Status:   Future    Standing Expiration Date:   04/19/2017  . Ambulatory referral to Cardiology    Referral Priority:   Routine    Referral Type:   Consultation    Referral Reason:   Specialty Services Required    Requested Specialty:   Cardiology    Number of Visits Requested:   1  . POCT urinalysis dipstick  . EKG 12-Lead   Meds ordered this encounter  Medications  .     Marland Kitchen Vitamin D, Ergocalciferol, (DRISDOL) 50000 units CAPS capsule    Sig: Take 1 capsule (50,000 Units total) by mouth every 7 (seven) days.    Dispense:  8 capsule    Refill:  0  He declines flu shot at today's visit.  Return to clinic as instructed above and also call on an as needed bases for appointments. Use 911 for emergency situations and emergency room or urgent care for any after hours healthcare needs or if any emergency  at anytime  Patient verbalized understanding of instructions and denies any further questions at this time.

## 2016-10-19 NOTE — Patient Instructions (Addendum)
Vitamin D Deficiency Vitamin D deficiency is when your body does not have enough vitamin D. Vitamin D is important because:  It helps your body use other minerals that your body needs.  It helps keep your bones strong and healthy.  It may help to prevent some diseases.  It helps your heart and other muscles work well.  You can get vitamin D by:  Eating foods with vitamin D in them.  Drinking or eating milk or other foods that have had vitamin D added to them.  Taking a vitamin D supplement.  Being in the sun.  Not getting enough vitamin D can make your bones become soft. It can also cause other health problems. Follow these instructions at home:  Take medicines and supplements only as told by your doctor.  Eat foods that have vitamin D. These include: ? Dairy products, cereals, or juices with added vitamin D. Check the label for vitamin D. ? Fatty fish like salmon or trout. ? Eggs. ? Oysters.  Do not use tanning beds.  Stay at a healthy weight. Lose weight, if needed.  Keep all follow-up visits as told by your doctor. This is important. Contact a doctor if:  Your symptoms do not go away.  You feel sick to your stomach (nauseous).  Youthrow up (vomit).  You poop less often than usual or you have trouble pooping (constipation). This information is not intended to replace advice given to you by your health care provider. Make sure you discuss any questions you have with your health care provider. Document Released: 12/15/2010 Document Revised: 06/03/2015 Document Reviewed: 05/13/2014 Elsevier Interactive Patient Education  2018 [employer] Practicing safe sex means taking steps before and during sex to reduce your risk of:  Getting an STD (sexually transmitted disease).  Giving your partner an STD.  Unwanted pregnancy.  How can I practice safe sex?  To practice safe sex:  Limit your sexual partners to only one partner who is having sex with only  you.  Avoid using alcohol and recreational drugs before having sex. These substances can affect your judgment.  Before having sex with a new partner: ? Talk to your partner about past partners, past STDs, and drug use. ? You and your partner should be screened for STDs and discuss the results with each other.  Check your body regularly for sores, blisters, rashes, or unusual discharge. If you notice any of these problems, visit your health care provider.  If you have symptoms of an infection or you are being treated for an STD, avoid sexual contact.  While having sex, use a condom. Make sure to: ? Use a condom every time you have vaginal, oral, or anal sex. Both females and males should wear condoms during oral sex. ? Keep condoms in place from the beginning to the end of sexual activity. ? Use a latex condom, if possible. Latex condoms offer the best protection. ? Use only water-based lubricants or oils to lubricate a condom. Using petroleum-based lubricants or oils will weaken the condom and increase the chance that it will break.  See your health care provider for regular screenings, exams, and tests for STDs.  Talk with your health care provider about the form of birth control (contraception) that is best for you.  Get vaccinated against hepatitis B and human papillomavirus (HPV).  If you are at risk of being infected with HIV (human immunodeficiency virus), talk with your health care provider about taking a prescription medicine to  prevent HIV infection. You are considered at risk for HIV if: ? You are a man who has sex with other men. ? You are a heterosexual man or woman who is sexually active with more than one partner. ? You take drugs by injection. ? You are sexually active with a partner who has HIV.  This information is not intended to replace advice given to you by your health care provider. Make sure you discuss any questions you have with your health care  provider. Document Released: 02/03/2004 Document Revised: 05/12/2015 Document Reviewed: 11/15/2014 Elsevier Interactive Patient Education  2018 [employer]. Cholesterol Cholesterol is a white, waxy, fat-like substance that is needed by the human body in small amounts. The liver makes all the cholesterol we need. Cholesterol is carried from the liver by the blood through the blood vessels. Deposits of cholesterol (plaques) may build up on blood vessel (artery) walls. Plaques make the arteries narrower and stiffer. Cholesterol plaques increase the risk for heart attack and stroke. You cannot feel your cholesterol level even if it is very high. The only way to know that it is high is to have a blood test. Once you know your cholesterol levels, you should keep a record of the test results. Work with your health care provider to keep your levels in the desired range. What do the results mean?  Total cholesterol is a rough measure of all the cholesterol in your blood.  LDL (low-density lipoprotein) is the "bad" cholesterol. This is the type that causes plaque to build up on the artery walls. You want this level to be low.  HDL (high-density lipoprotein) is the "good" cholesterol because it cleans the arteries and carries the LDL away. You want this level to be high.  Triglycerides are fat that the body can either burn for energy or store. High levels are closely linked to heart disease. What are the desired levels of cholesterol?  Total cholesterol below 200.  LDL below 100 for people who are at risk, below 70 for people at very high risk.  HDL above 40 is good. A level of 60 or higher is considered to be protective against heart disease.  Triglycerides below 150. How can I lower my cholesterol? Diet Follow your diet program as told by your health care provider.  Choose fish or white meat chicken and [place], roasted or baked. Limit fatty cuts of red meat, fried foods, and processed meats,  such as sausage and lunch meats.  Eat lots of fresh fruits and vegetables.  Choose whole grains, beans, pasta, potatoes, and cereals.  Choose olive oil, corn oil, or canola oil, and use only small amounts.  Avoid butter, mayonnaise, shortening, or palm kernel oils.  Avoid foods with trans fats.  Drink skim or nonfat milk and eat low-fat or nonfat yogurt and cheeses. Avoid whole milk, cream, ice cream, egg yolks, and full-fat cheeses.  Healthier desserts include angel food cake, ginger snaps, animal crackers, hard candy, popsicles, and low-fat or nonfat frozen yogurt. Avoid pastries, cakes, pies, and cookies.  Exercise  Follow your exercise program as told by your health care provider. A regular program: ? Helps to decrease LDL and raise HDL. ? Helps with weight control.  Do things that increase your activity level, such as gardening, walking, and taking the stairs.  Ask your health care provider about ways that you can be more active in your daily life.  Medicine  Take over-the-counter and prescription medicines only as told by your health  care provider. ? Medicine may be prescribed by your health care provider to help lower cholesterol and decrease the risk for heart disease. This is usually done if diet and exercise have failed to bring down cholesterol levels. ? If you have several risk factors, you may need medicine even if your levels are normal.  This information is not intended to replace advice given to you by your health care provider. Make sure you discuss any questions you have with your health care provider. Document Released: 09/20/2000 Document Revised: 07/24/2015 Document Reviewed: 06/26/2015 Elsevier Interactive Patient Education  2017 [employer]. Diabetes Mellitus and Food It is important for you to manage your blood sugar (glucose) level. Your blood glucose level can be greatly affected by what you eat. Eating healthier foods in the appropriate amounts  throughout the day at about the same time each day will help you control your blood glucose level. It can also help slow or prevent worsening of your diabetes mellitus. Healthy eating may even help you improve the level of your blood pressure and reach or maintain a healthy weight. General recommendations for healthful eating and cooking habits include:  Eating meals and snacks regularly. Avoid going long periods of time without eating to lose weight.  Eating a diet that consists mainly of plant-based foods, such as fruits, vegetables, nuts, legumes, and whole grains.  Using low-heat cooking methods, such as baking, instead of high-heat cooking methods, such as deep frying.  Work with your dietitian to make sure you understand how to use the Nutrition Facts information on food labels. How can food affect me? Carbohydrates Carbohydrates affect your blood glucose level more than any other type of food. Your dietitian will help you determine how many carbohydrates to eat at each meal and teach you how to count carbohydrates. Counting carbohydrates is important to keep your blood glucose at a healthy level, especially if you are using insulin or taking certain medicines for diabetes mellitus. Alcohol Alcohol can cause sudden decreases in blood glucose (hypoglycemia), especially if you use insulin or take certain medicines for diabetes mellitus. Hypoglycemia can be a life-threatening condition. Symptoms of hypoglycemia (sleepiness, dizziness, and disorientation) are similar to symptoms of having too much alcohol. If your health care provider has given you approval to drink alcohol, do so in moderation and use the following guidelines:  Women should not have more than one drink per day, and men should not have more than two drinks per day. One drink is equal to: ? 12 oz of beer. ? 5 oz of wine. ? 1 oz of hard liquor.  Do not drink on an empty stomach.  Keep yourself hydrated. Have water, diet  soda, or unsweetened iced tea.  Regular soda, juice, and other mixers might contain a lot of carbohydrates and should be counted.  What foods are not recommended? As you make food choices, it is important to remember that all foods are not the same. Some foods have fewer nutrients per serving than other foods, even though they might have the same number of calories or carbohydrates. It is difficult to get your body what it needs when you eat foods with fewer nutrients. Examples of foods that you should avoid that are high in calories and carbohydrates but low in nutrients include:  Trans fats (most processed foods list trans fats on the Nutrition Facts label).  Regular soda.  Juice.  Candy.  Sweets, such as cake, pie, doughnuts, and cookies.  Fried foods.  What foods can  I eat? Eat nutrient-rich foods, which will nourish your body and keep you healthy. The food you should eat also will depend on several factors, including:  The calories you need.  The medicines you take.  Your weight.  Your blood glucose level.  Your blood pressure level.  Your cholesterol level.  You should eat a variety of foods, including:  Protein. ? Lean cuts of meat. ? Proteins low in saturated fats, such as fish, egg whites, and beans. Avoid processed meats.  Fruits and vegetables. ? Fruits and vegetables that may help control blood glucose levels, such as apples, mangoes, and yams.  Dairy products. ? Choose fat-free or low-fat dairy products, such as milk, yogurt, and cheese.  Grains, bread, pasta, and rice. ? Choose whole grain products, such as multigrain bread, whole oats, and brown rice. These foods may help control blood pressure.  Fats. ? Foods containing healthful fats, such as nuts, avocado, olive oil, canola oil, and fish.  Does everyone with diabetes mellitus have the same meal plan? Because every person with diabetes mellitus is different, there is not one meal plan that works  for everyone. It is very important that you meet with a dietitian who will help you create a meal plan that is just right for you. This information is not intended to replace advice given to you by your health care provider. Make sure you discuss any questions you have with your health care provider. Document Released: 09/22/2004 Document Revised: 06/03/2015 Document Reviewed: 11/22/2012 Elsevier Interactive Patient Education  2017 [place] Maintenance, Male A healthy lifestyle and preventive care is important for your health and wellness. Ask your health care provider about what schedule of regular examinations is right for you. What should I know about weight and diet? Eat a Healthy Diet  Eat plenty of vegetables, fruits, whole grains, low-fat dairy products, and lean protein.  Do not eat a lot of foods high in solid fats, added sugars, or salt.  Maintain a Healthy Weight Regular exercise can help you achieve or maintain a healthy weight. You should:  Do at least 150 minutes of exercise each week. The exercise should increase your heart rate and make you sweat (moderate-intensity exercise).  Do strength-training exercises at least twice a week.  Watch Your Levels of Cholesterol and Blood Lipids  Have your blood tested for lipids and cholesterol every 5 years starting at 54 years of age. If you are at high risk for heart disease, you should start having your blood tested when you are 54 years old. You may need to have your cholesterol levels checked more often if: ? Your lipid or cholesterol levels are high. ? You are older than 54 years of age. ? You are at high risk for heart disease.  What should I know about cancer screening? Many types of cancers can be detected early and may often be prevented. Lung Cancer  You should be screened every year for lung cancer if: ? You are a current smoker who has smoked for at least 30 years. ? You are a former smoker who has quit  within the past 15 years.  Talk to your health care provider about your screening options, when you should start screening, and how often you should be screened.  Colorectal Cancer  Routine colorectal cancer screening usually begins at 54 years of age and should be repeated every 5-10 years until you are 54 years old. You may need to be screened more often if early  forms of precancerous polyps or small growths are found. Your health care provider may recommend screening at an earlier age if you have risk factors for colon cancer.  Your health care provider may recommend using home test kits to check for hidden blood in the stool.  A small camera at the end of a tube can be used to examine your colon (sigmoidoscopy or colonoscopy). This checks for the earliest forms of colorectal cancer.  Prostate and Testicular Cancer  Depending on your age and overall health, your health care provider may do certain tests to screen for prostate and testicular cancer.  Talk to your health care provider about any symptoms or concerns you have about testicular or prostate cancer.  Skin Cancer  Check your skin from head to toe regularly.  Tell your health care provider about any new moles or changes in moles, especially if: ? There is a change in a mole's size, shape, or color. ? You have a mole that is larger than a pencil eraser.  Always use sunscreen. Apply sunscreen liberally and repeat throughout the day.  Protect yourself by wearing long sleeves, pants, a wide-brimmed hat, and sunglasses when outside.  What should I know about heart disease, diabetes, and high blood pressure?  If you are 69-10 years of age, have your blood pressure checked every 3-5 years. If you are 49 years of age or older, have your blood pressure checked every year. You should have your blood pressure measured twice-once when you are at a hospital or clinic, and once when you are not at a hospital or clinic. Record the average  of the two measurements. To check your blood pressure when you are not at a hospital or clinic, you can use: ? An automated blood pressure machine at a pharmacy. ? A home blood pressure monitor.  Talk to your health care provider about your target blood pressure.  If you are between 53-83 years old, ask your health care provider if you should take aspirin to prevent heart disease.  Have regular diabetes screenings by checking your fasting blood sugar level. ? If you are at a normal weight and have a low risk for diabetes, have this test once every three years after the age of 42. ? If you are overweight and have a high risk for diabetes, consider being tested at a younger age or more often.  A one-time screening for abdominal aortic aneurysm (AAA) by ultrasound is recommended for men aged 38-75 years who are current or former smokers. What should I know about preventing infection? Hepatitis B If you have a higher risk for hepatitis B, you should be screened for this virus. Talk with your health care provider to find out if you are at risk for hepatitis B infection. Hepatitis C Blood testing is recommended for:  Everyone born from [birth year] through 1963.  Anyone with known risk factors for hepatitis C.  Sexually Transmitted Diseases (STDs)  You should be screened each year for STDs including gonorrhea and chlamydia if: ? You are sexually active and are younger than 54 years of age. ? You are older than 54 years of age and your health care provider tells you that you are at risk for this type of infection. ? Your sexual activity has changed since you were last screened and you are at an increased risk for chlamydia or gonorrhea. Ask your health care provider if you are at risk.  Talk with your health care provider about whether you are  at high risk of being infected with HIV. Your health care provider may recommend a prescription medicine to help prevent HIV infection.  What else can I  do?  Schedule regular health, dental, and eye exams.  Stay current with your vaccines (immunizations).  Do not use any tobacco products, such as cigarettes, chewing tobacco, and e-cigarettes. If you need help quitting, ask your health care provider.  Limit alcohol intake to no more than 2 drinks per day. One drink equals 12 ounces of beer, 5 ounces of wine, or 1 ounces of hard liquor.  Do not use street drugs.  Do not share needles.  Ask your health care provider for help if you need support or information about quitting drugs.  Tell your health care provider if you often feel depressed.  Tell your health care provider if you have ever been abused or do not feel safe at home. This information is not intended to replace advice given to you by your health care provider. Make sure you discuss any questions you have with your health care provider. Document Released: 06/24/2007 Document Revised: 08/25/2015 Document Reviewed: 09/29/2014 Elsevier Interactive Patient Education  2018 [employer].   How to Increase Your Level of Physical Activity Getting regular physical activity is important for your overall health and well-being. Most people do not get enough exercise. There are easy ways to increase your level of physical activity, even if you have not been very active in the past or you are just starting out. Why is physical activity important? Physical activity has many short-term and long-term health benefits. Regular exercise can:  Help you lose weight or maintain a healthy weight.  Strengthen your muscles and bones.  Boost your mood and improve self-esteem.  Reduce your risk of certain long-term (chronic) diseases, like heart disease, cancer, and diabetes.  Help you stay capable of walking and moving around (mobile) as you age.  Prevent accidents, such as falls, as you age.  Increase life expectancy.  What are the benefits of being physically active on a regular basis? In  addition to improving your physical health, being physically active on most days of the week can help you in ways that you may not expect. Benefits of regular physical activity may include:  Feeling good about your body.  Being able to move around more easily and for longer periods of time without getting tired (increased stamina).  Finding new sources of fun and enjoyment.  Meeting new people who share a common interest.  Being able to fight off illness better (enhanced immunity).  Being able to sleep better.  What can happen if I am not physically active on a regular basis? Not getting enough physical activity can lead to an unhealthy lifestyle and future health problems. This can increase your chances of:  Becoming overweight or obese.  Becoming sick.  Developing chronic illnesses, like heart disease or diabetes.  Having mental health problems, like depression or anxiety.  Having sleep problems.  Having trouble walking or getting yourself around (reduced mobility).  Injuring yourself in a fall as you get older.  What steps can I take to be more physically active?  Check with your health care provider about how to get started. Ask your health care provider what activities are safe for you.  Start out slowly. Walking or doing some simple chair exercises is a good place to start, especially if you have not been active before or for a long time.  Try to find activities that  you enjoy. You are more likely to commit to an exercise routine if it does not feel like a chore.  If you have bone or joint problems, choose low-impact exercises, like walking or swimming.  Include physical activity in your everyday routine.  Invite friends or family members to exercise with you. This also will help you commit to your workout plan.  Set goals that you can work toward.  Aim for at least 150 minutes of moderate-intensity exercise each week. Examples of moderate-intensity exercise  include walking or riding a bike. Where to find more information:  Centers for Disease Control and Prevention: [employer]  [employer] on [employer], Sports & Nutrition www.http://villegas.org/  ChooseMyPlate: [employer] Contact a health care provider if:  You have headaches, muscle aches, or joint pain.  You feel dizzy or light-headed while exercising.  You faint.  You have chest pain while exercising. Summary  Exercise benefits your mind and body at any age, even if you are just starting out.  If you have a chronic illness or have not been active for a while, check with your health care provider before increasing your physical activity.  Choose activities that are safe and enjoyable for you.Ask your health care provider what activities are safe for you.  Start slowly. Tell your health care provider if you have problems as you start to increase your activity level. This information is not intended to replace advice given to you by your health care provider. Make sure you discuss any questions you have with your health care provider. Document Released: 12/16/2015 Document Revised: 12/16/2015 Document Reviewed: 12/16/2015 Elsevier Interactive Patient Education  [employer].

## 2016-10-20 LAB — HIV ANTIBODY (ROUTINE TESTING W REFLEX): HIV Screen 4th Generation wRfx: NONREACTIVE

## 2016-10-20 LAB — HCV COMMENT:

## 2016-10-20 LAB — HEPATITIS C ANTIBODY (REFLEX): HCV Ab: <0.1 s/co ratio (ref 0.0–0.9)

## 2016-10-23 ENCOUNTER — Telehealth: Payer: Self-pay

## 2016-10-23 NOTE — Telephone Encounter (Signed)
Left message with patient that  lab results were negative and to call the clinic with any questions.

## 2016-10-24 ENCOUNTER — Ambulatory Visit: Payer: Self-pay | Admitting: Dietician

## 2016-10-24 ENCOUNTER — Telehealth: Payer: Self-pay

## 2016-10-24 NOTE — Telephone Encounter (Signed)
Called to discuss low Vitamin D level and will start taking Vitamin D supplement called into pharmacy. Recheck in 3 months.Micheal Holland

## 2016-10-24 NOTE — Progress Notes (Signed)
Nutrition: 10/162018  CC: "My sugar and cholesterol are up. I need to get them down."  Assessment: HT: 73.5 inches WT: 218.8 lbs (Has lost 3.2 lb since last clinic appointment.) DX: Hyperlipidemia, prediabetes. Labs: Glucose fasting=108, HgA1C= 6.1% Increased cholesterol levels with TG at 136 mg/dl.  Activity:  Works in Print production planner at [employer].  Has no regular physical activity outside work other than mowing the lawn of his duplex apartment.Willing to do more walking in the parking lot across the street. Meals: Eats 3 times per day.  No snacking.  Limited range of foods he eats.  B: PB crackers, coffee with honey. L: Leftovers or [employer] or [employer]. Typically will include a bread, meat, potatoes. With water to drink.  Meats are fried.Typically fried meat with  starchy vegetable or pasta, and bread. S: 4:30-5:00 Will have 2 (20) oz regular beers. D: 7:30 -8:00 pm cooks at home. Meat that usually  fried, potatoes, green beans, mac and cheese with water to drink.  Recommendations: Get more physical activity.  [place] , aim for 150 minutes per week. Lose some more weight.  Total loss of about 15.5 lbs, to 205-206 lbs. Decrease  HgA1C to 5.7% or less. Have 1 starch at a meal.  Biscuit or creamed potatoes not both. Increase servings of green beans or other non-starchy vegetables. Limit serving sizes of butter and other fats. Use leaner meats. Bake, Broil, grill, roast, Avoid frying and fried foods. Use oils rather than solid fats (animal fats). Use lite beer with less carb or decrease serving size of regular beer.  Plan/Personal Goals: Walk regularly. Try to use lite beer. Keep losing weight.  F/U 30 minutes in November after A1C blood draw.  Teaching Materials.: Eat Right Food Label, Blood Glucose Control, FoodGroups with carb serving sizes, My Plate (Diabetes).

## 2016-12-08 ENCOUNTER — Other Ambulatory Visit: Payer: Self-pay

## 2016-12-13 ENCOUNTER — Other Ambulatory Visit: Payer: Self-pay

## 2016-12-13 DIAGNOSIS — E559 Vitamin D deficiency, unspecified: Secondary | ICD-10-CM

## 2016-12-14 ENCOUNTER — Telehealth: Payer: Self-pay

## 2016-12-14 LAB — VITAMIN D 25 HYDROXY (VIT D DEFICIENCY, FRACTURES): Vit D, 25-Hydroxy: 32.7 ng/mL (ref 30.0–100.0)

## 2016-12-14 NOTE — Telephone Encounter (Signed)
Contacted patient to be sure he made a cardiology follow up appointment.  His appointment is scheduled for December 22nd with [facility]. He was instructed to take Vitamin D 2000 international units daily.  Verbalizes understanding of instructions and will follow up with [facility] as needed.

## 2016-12-26 ENCOUNTER — Encounter: Payer: Self-pay | Admitting: Cardiovascular Disease

## 2016-12-26 ENCOUNTER — Ambulatory Visit: Payer: BLUE CROSS/BLUE SHIELD | Admitting: Cardiovascular Disease

## 2016-12-26 ENCOUNTER — Encounter: Payer: Self-pay | Admitting: *Deleted

## 2016-12-26 VITALS — BP 154/90 | HR 88 | Ht 74.5 in | Wt 226.5 lb

## 2016-12-26 DIAGNOSIS — Z7689 Persons encountering health services in other specified circumstances: Secondary | ICD-10-CM

## 2016-12-26 DIAGNOSIS — R0602 Shortness of breath: Secondary | ICD-10-CM | POA: Diagnosis not present

## 2016-12-26 DIAGNOSIS — R030 Elevated blood-pressure reading, without diagnosis of hypertension: Secondary | ICD-10-CM

## 2016-12-26 DIAGNOSIS — E785 Hyperlipidemia, unspecified: Secondary | ICD-10-CM | POA: Diagnosis not present

## 2016-12-26 NOTE — Patient Instructions (Addendum)
Medication Instructions:  Your physician recommends that you continue on your current medications as directed. Please refer to the Current Medication list given to you today.   Labwork: none  Testing/Procedures: Your physician has requested that you have an echocardiogram. Echocardiography is a painless test that uses sound waves to create images of your heart. It provides your doctor with information about the size and shape of your heart and how well your heart's chambers and valves are working. This procedure takes approximately one hour. There are no restrictions for this procedure.    CT Cardiac Scoring [facility] [address] [phone] payable at the time of the test. January 7, 8:15am arrival     Follow-Up: Your physician recommends that you schedule a follow-up appointment in: 1-2 months with Dr. Fletcher Anon.    Any Other Special Instructions Will Be Listed Below (If Applicable).     If you need a refill on your cardiac medications before your next appointment, please call your pharmacy.  Echocardiogram An echocardiogram, or echocardiography, uses sound waves (ultrasound) to produce an image of your heart. The echocardiogram is simple, painless, obtained within a short period of time, and offers valuable information to your health care provider. The images from an echocardiogram can provide information such as:  Evidence of coronary artery disease (CAD).  Heart size.  Heart muscle function.  Heart valve function.  Aneurysm detection.  Evidence of a past heart attack.  Fluid buildup around the heart.  Heart muscle thickening.  Assess heart valve function.  Tell a health care provider about:  Any allergies you have.  All medicines you are taking, including vitamins, herbs, eye drops, creams, and over-the-counter medicines.  Any problems you or family members have had with anesthetic medicines.  Any blood disorders you  have.  Any surgeries you have had.  Any medical conditions you have.  Whether you are pregnant or may be pregnant. What happens before the procedure? No special preparation is needed. Eat and drink normally. What happens during the procedure?  In order to produce an image of your heart, gel will be applied to your chest and a wand-like tool (transducer) will be moved over your chest. The gel will help transmit the sound waves from the transducer. The sound waves will harmlessly bounce off your heart to allow the heart images to be captured in real-time motion. These images will then be recorded.  You may need an IV to receive a medicine that improves the quality of the pictures. What happens after the procedure? You may return to your normal schedule including diet, activities, and medicines, unless your health care provider tells you otherwise. This information is not intended to replace advice given to you by your health care provider. Make sure you discuss any questions you have with your health care provider. Document Released: 12/24/1999 Document Revised: 08/14/2015 Document Reviewed: 09/02/2012 Elsevier Interactive Patient Education  2017 [employer].

## 2016-12-26 NOTE — Progress Notes (Signed)
Cardiology Office Note   Date:  12/26/2016   ID:  Micheal Holland, DOB 08/30/1960, MRN [id number]  PCP:  Doreen Beam, FNP  Cardiologist:   Kathlyn Sacramento, MD   Chief Complaint  Patient presents with  . other    Family Hx cardiac arrest no complaints today. Meds reviewed verbally with pt.      History of Present Illness: Micheal Holland is a 56 y.o. male who was referred by Laverna Peace for evaluation of family history of premature coronary artery disease.  The patient has no prior cardiac history.  He reports borderline diabetes currently not on medications and was recently told about elevated cholesterol.  He is not a smoker.  He has strong family history of premature coronary artery disease.  His father died at the age of 12 of myocardial infarction.  Mother died at the age of 75 of myocardial infarction in his brother had multiple strokes. He started exercising on the treadmill recently.  He reports mild exertional dyspnea without chest pain or palpitations.  No orthopnea, PND or leg edema.    Past Medical History:  Diagnosis Date  . Allergy   . Hyperlipidemia     Past Surgical History:  Procedure Laterality Date  . COLONOSCOPY WITH PROPOFOL N/A 04/03/2016   Procedure: COLONOSCOPY WITH PROPOFOL;  Surgeon: Lollie Sails, MD;  Location: [facility];  Service: Endoscopy;  Laterality: N/A;  . none       Current Outpatient Medications  Medication Sig Dispense Refill  . VITAMIN D, ERGOCALCIFEROL, PO Take by mouth 2 (two) times daily.     No current facility-administered medications for this visit.     Allergies:   Other    Social History:  The patient  reports that  has never smoked. he has never used smokeless tobacco. He reports that he drinks about 2.4 oz of alcohol per week. He reports that he does not use drugs.   Family History:  The patient's family history includes Heart Problems in his sister; Heart attack in his brother, father,  and mother; Heart disease in his brother, father, and mother; Hypertension in his father and mother; Kidney disease in his sister.    ROS:  Please see the history of present illness.   Otherwise, review of systems are positive for none.   All other systems are reviewed and negative.    PHYSICAL EXAM: VS:  BP (!) 154/90 (BP Location: Right Arm, Patient Position: Sitting, Cuff Size: Normal)   Pulse 88   Ht 6' 2.5" (1.892 m)   Wt 226 lb 8 oz (102.7 kg)   BMI 28.69 kg/m  , BMI Body mass index is 28.69 kg/m. GEN: Well nourished, well developed, in no acute distress  HEENT: normal  Neck: no JVD, carotid bruits, or masses Cardiac: RRR; no murmurs, rubs, or gallops,no edema  Respiratory:  clear to auscultation bilaterally, normal work of breathing GI: soft, nontender, nondistended, + BS MS: no deformity or atrophy  Skin: warm and dry, no rash Neuro:  Strength and sensation are intact Psych: euthymic mood, full affect   EKG:  EKG is ordered today. The ekg ordered today demonstrates normal sinus rhythm with no significant ST or T wave changes.   Recent Labs: 10/10/2016: ALT 26; BUN 13; Creatinine, Ser 1.20; Hemoglobin 14.3; Platelets 274; Potassium 4.5; Sodium 142; TSH 3.450    Lipid Panel    Component Value Date/Time   CHOL 285 (H) 10/10/2016 0833   TRIG 136  10/10/2016 0833   HDL 63 10/10/2016 0833   CHOLHDL 4.5 10/10/2016 0833   LDLCALC 195 (H) 10/10/2016 0833      Wt Readings from Last 3 Encounters:  12/26/16 226 lb 8 oz (102.7 kg)  10/19/16 222 lb (100.7 kg)  09/11/16 220 lb (99.8 kg)     PAD Screen 12/26/2016  Previous PAD dx? No  Previous surgical procedure? No  Pain with walking? No  Feet/toe relief with dangling? No  Painful, non-healing ulcers? No  Extremities discolored? No      ASSESSMENT AND PLAN:  1.  Screening for cardiovascular disease: Other than mild dyspnea, the patient has no convincing symptoms of angina.  His cardiac exam is normal and  baseline EKG is unremarkable. Given his strong family history of premature coronary artery disease, the option is to proceed with coronary calcium score for risk stratification.  I do not think stress testing is going to be helpful in his situation. I requested an echocardiogram to ensure no structural heart abnormalities.  2.  Hyperlipidemia: Recent lipid profile showed an LDL of 195 with an HDL of 63.  The patient will require treatment with a statin but the intensity will depend on whether he has atherosclerosis or not.  3.  Elevated blood pressure without hypertension: His blood pressure is usually not elevated.  Continue to monitor closely.  Disposition:   FU with me after cardiac testing.  Signed,  Kathlyn Sacramento, MD  12/26/2016 3:09 PM    [facility]

## 2016-12-27 ENCOUNTER — Encounter: Payer: Self-pay | Admitting: Adult Health

## 2016-12-27 ENCOUNTER — Ambulatory Visit: Payer: Self-pay | Admitting: Adult Health

## 2016-12-27 VITALS — BP 130/86 | HR 85 | Temp 97.7°F | Resp 16 | Ht 74.0 in | Wt 227.0 lb

## 2016-12-27 DIAGNOSIS — L739 Follicular disorder, unspecified: Secondary | ICD-10-CM

## 2016-12-27 MED ORDER — SULFAMETHOXAZOLE-TRIMETHOPRIM 800-160 MG PO TABS: 1.0000 | ORAL_TABLET | Freq: Two times a day (BID) | ORAL | 0 refills | Status: DC

## 2016-12-27 MED ORDER — MUPIROCIN CALCIUM 2 % EX CREA: 1.0000 "application " | TOPICAL_CREAM | Freq: Two times a day (BID) | CUTANEOUS | 0 refills | Status: AC

## 2016-12-27 NOTE — Patient Instructions (Signed)

## 2016-12-27 NOTE — Progress Notes (Addendum)
Subjective:     Patient ID: DAT DERKSEN, male   DOB: [birth year], 56 y.o.   MRN: [id number]  HPI   Patient is a 56 year old African American male in no acute distress who reports he noticed a bump under his arm this morning in shower. Denies itching. Denies any pother areas of concern.,   Patient  denies any fever, chills, rash, chest pain, shortness of breath, nausea, vomiting, or diarrhea. He denies nay drainage.  Note : He has visited Dr. Fletcher Anon cardiologist for mild dyspnea and significant family history of early cardiac death. He has further testing scheduled and will continue  follow up with cardiology as directed. He denies any symptoms related to this presently or recently.   Blood pressure 130/86, pulse 85, temperature 97.7 F (36.5 C), resp. rate 16, height 6\' 2"  (1.88 m), weight 227 lb (103 kg), SpO2 99 %.  Current Outpatient Medications:  Marland Kitchen  VITAMIN D, ERGOCALCIFEROL, PO, Take by mouth 2 (two) times daily., Disp: , Rfl:     Review of Systems  Constitutional: Negative.   Cardiovascular: Negative.   Genitourinary: Negative.   Musculoskeletal: Negative.   Skin:       " small area " under my right armpit noticed this morning. Patient denies any drainage, redness or spreading redness.   Hematological: Negative.   Psychiatric/Behavioral: Negative.        Objective:   Physical Exam  Constitutional: He is oriented to person, place, and time. He appears well-developed and well-nourished. No distress.  HENT:  Head: Normocephalic and atraumatic.  Cardiovascular: Normal rate, regular rhythm, normal heart sounds and intact distal pulses. Exam reveals no gallop and no friction rub.  No murmur heard. Pulmonary/Chest: Effort normal and breath sounds normal. No respiratory distress. He has no wheezes. He has no rales. He exhibits no tenderness.  Abdominal: Soft.  Musculoskeletal: Normal range of motion.  Patient moves on and off of exam table and in room without difficulty.  Gait is normal in hall and in room. Patient is oriented to person place time and situation. Patient answers questions appropriately and engages in conversation.   Lymphadenopathy:    He has no cervical adenopathy.    He has no axillary adenopathy.  Neurological: He is alert and oriented to person, place, and time. He has normal reflexes.  Skin: Skin is warm and dry. Rash noted. Pustular rash: area of raised pinpoint pimple yellow pus visualized in center  without induration, mild erythem at raised area of pimple no surrounding erythema center of right axillary. No other areas noted / present x 1 day . No noticable  in grown hair.  There is erythema (mild pinpoint area ). No pallor.     Right axillary( side only marked on diagram)   Psychiatric: He has a normal mood and affect. His behavior is normal. Judgment and thought content normal.  Nursing note and vitals reviewed.      Assessment:     Folliculitis of right axilla     Plan:     Meds ordered this encounter  Medications  . sulfamethoxazole-trimethoprim (BACTRIM DS,SEPTRA DS) 800-160 MG tablet    Sig: Take 1 tablet by mouth 2 (two) times daily.    Dispense:  14 tablet    Refill:  0  . mupirocin cream (BACTROBAN) 2 %    Sig: Apply 1 application topically 2 (two) times daily for 10 days. To right under arm area of concern    Dispense:  30 g  Refill:  0    May do 20 gram tube if available or cheaper   He is instructed to use antibacterial soap for washing axillary area 1 to 2 times daily.   Advised to return to clinic for an appointment if no improvement within 72 hours or if any symptoms change or worsen. Advised ER or urgent Care if after hours or on weekend. 911 for emergency symptoms at any time.   Return in about 2 weeks (around 01/10/2017), or if symptoms worsen or fail to improve .as instructed above   Patient verbalized understanding of all instructions given and denies any further questions at this time.

## 2017-01-10 ENCOUNTER — Ambulatory Visit: Payer: Self-pay | Admitting: Adult Health

## 2017-01-12 ENCOUNTER — Ambulatory Visit (INDEPENDENT_AMBULATORY_CARE_PROVIDER_SITE_OTHER): Payer: BLUE CROSS/BLUE SHIELD

## 2017-01-12 ENCOUNTER — Other Ambulatory Visit: Payer: Self-pay

## 2017-01-12 DIAGNOSIS — R0602 Shortness of breath: Secondary | ICD-10-CM

## 2017-01-15 ENCOUNTER — Ambulatory Visit (INDEPENDENT_AMBULATORY_CARE_PROVIDER_SITE_OTHER)
Admission: RE | Admit: 2017-01-15 | Discharge: 2017-01-15 | Disposition: A | Discharge status: Home / Self Care | Payer: Self-pay | Source: Ambulatory Visit | Attending: Cardiovascular Disease | Admitting: Cardiovascular Disease

## 2017-01-15 DIAGNOSIS — R0602 Shortness of breath: Secondary | ICD-10-CM

## 2017-01-17 ENCOUNTER — Other Ambulatory Visit: Payer: Self-pay

## 2017-01-17 DIAGNOSIS — E7800 Pure hypercholesterolemia, unspecified: Secondary | ICD-10-CM

## 2017-01-17 MED ORDER — ASPIRIN EC 81 MG PO TBEC: 81.0000 mg | DELAYED_RELEASE_TABLET | Freq: Every day | ORAL | 3 refills | Status: DC

## 2017-01-17 MED ORDER — ROSUVASTATIN CALCIUM 20 MG PO TABS: 20.0000 mg | ORAL_TABLET | Freq: Every day | ORAL | 3 refills | Status: DC

## 2017-01-29 ENCOUNTER — Ambulatory Visit (INDEPENDENT_AMBULATORY_CARE_PROVIDER_SITE_OTHER): Payer: BLUE CROSS/BLUE SHIELD | Admitting: Cardiovascular Disease

## 2017-01-29 ENCOUNTER — Encounter: Payer: Self-pay | Admitting: Cardiovascular Disease

## 2017-01-29 VITALS — BP 142/89 | HR 88 | Ht 74.0 in | Wt 230.0 lb

## 2017-01-29 DIAGNOSIS — R931 Abnormal findings on diagnostic imaging of heart and coronary circulation: Secondary | ICD-10-CM | POA: Insufficient documentation

## 2017-01-29 DIAGNOSIS — R030 Elevated blood-pressure reading, without diagnosis of hypertension: Secondary | ICD-10-CM

## 2017-01-29 DIAGNOSIS — E785 Hyperlipidemia, unspecified: Secondary | ICD-10-CM

## 2017-01-29 NOTE — Patient Instructions (Signed)
Medication Instructions: Continue same medications.   Labwork: None.   Procedures/Testing: None.   Follow-Up: 6 months with Dr. Arida.   Any Additional Special Instructions Will Be Listed Below (If Applicable).     If you need a refill on your cardiac medications before your next appointment, please call your pharmacy.   

## 2017-01-29 NOTE — Progress Notes (Signed)
Cardiology Office Note   Date:  01/29/2017   ID:  Micheal Holland, DOB 09/27/1960, MRN [id number]  PCP:  Doreen Beam, FNP  Cardiologist:   Kathlyn Sacramento, MD   Chief Complaint  Patient presents with  . other    F/u echo 1-2 month f/u no complaints today. Meds reviewed verbally with pt.      History of Present Illness: Micheal Holland is a 57 y.o. male who is here today for follow-up visit to discuss cardiac testing.  He was referred recently due to family history of premature coronary artery disease.   He has history of borderline diabetes not on medication and hyperlipidemia. He is not a smoker.  He has strong family history of premature coronary artery disease.  His father died at the age of 46 of myocardial infarction.  Mother died at the age of 62 of myocardial infarction in his brother had multiple strokes. He was overall asymptomatic with no chest pain or significant dyspnea.  He had an echocardiogram done which showed normal LV systolic function with no significant valvular abnormalities.  CT calcium score was obtained came back abnormal with a score of 54.  Patient was started on low-dose aspirin and rosuvastatin.  He reports no side effects.   Past Medical History:  Diagnosis Date  . Allergy   . Hyperlipidemia     Past Surgical History:  Procedure Laterality Date  . COLONOSCOPY WITH PROPOFOL N/A 04/03/2016   Procedure: COLONOSCOPY WITH PROPOFOL;  Surgeon: Lollie Sails, MD;  Location: [facility] ENDOSCOPY;  Service: Endoscopy;  Laterality: N/A;  . none       Current Outpatient Medications  Medication Sig Dispense Refill  . aspirin EC 81 MG tablet Take 1 tablet (81 mg total) by mouth daily. 90 tablet 3  . rosuvastatin (CRESTOR) 20 MG tablet Take 1 tablet (20 mg total) by mouth daily. 90 tablet 3  . sulfamethoxazole-trimethoprim (BACTRIM DS,SEPTRA DS) 800-160 MG tablet Take 1 tablet by mouth 2 (two) times daily. 14 tablet 0  . VITAMIN D, ERGOCALCIFEROL,  PO Take by mouth 2 (two) times daily.     No current facility-administered medications for this visit.     Allergies:   Other    Social History:  The patient  reports that  has never smoked. he has never used smokeless tobacco. He reports that he drinks about 2.4 oz of alcohol per week. He reports that he does not use drugs.   Family History:  The patient's family history includes Heart Problems in his sister; Heart attack in his brother, father, and mother; Heart disease in his brother, father, and mother; Hypertension in his father and mother; Kidney disease in his sister.    ROS:  Please see the history of present illness.   Otherwise, review of systems are positive for none.   All other systems are reviewed and negative.    PHYSICAL EXAM: VS:  BP (!) 142/89 (BP Location: Left Arm, Patient Position: Sitting, Cuff Size: Large)   Pulse 88   Ht 6\' 2"  (1.88 m)   Wt 230 lb (104.3 kg)   BMI 29.53 kg/m  , BMI Body mass index is 29.53 kg/m. GEN: Well nourished, well developed, in no acute distress  HEENT: normal  Neck: no JVD, carotid bruits, or masses Cardiac: RRR; no murmurs, rubs, or gallops,no edema  Respiratory:  clear to auscultation bilaterally, normal work of breathing GI: soft, nontender, nondistended, + BS MS: no deformity or atrophy  Skin: warm and dry, no rash Neuro:  Strength and sensation are intact Psych: euthymic mood, full affect   EKG:  EKG is ordered today. The ekg ordered today demonstrates normal sinus rhythm with no significant ST or T wave changes.   Recent Labs: 10/10/2016: ALT 26; BUN 13; Creatinine, Ser 1.20; Hemoglobin 14.3; Platelets 274; Potassium 4.5; Sodium 142; TSH 3.450    Lipid Panel    Component Value Date/Time   CHOL 285 (H) 10/10/2016 0833   TRIG 136 10/10/2016 0833   HDL 63 10/10/2016 0833   CHOLHDL 4.5 10/10/2016 0833   LDLCALC 195 (H) 10/10/2016 0833      Wt Readings from Last 3 Encounters:  01/29/17 230 lb (104.3 kg)    12/27/16 227 lb (103 kg)  12/26/16 226 lb 8 oz (102.7 kg)     PAD Screen 12/26/2016  Previous PAD dx? No  Previous surgical procedure? No  Pain with walking? No  Feet/toe relief with dangling? No  Painful, non-healing ulcers? No  Extremities discolored? No      ASSESSMENT AND PLAN:  1.  Abnormal coronary calcium score: His score was 54.  Although it was abnormal, it was still below 100.   No symptoms at the present time.  Continue small dose aspirin and rosuvastatin.  2.  Hyperlipidemia: Most recent lipid profile showed an LDL of 195 with an HDL of 63.  Since then, he was started on rosuvastatin.  He is going for lipid and liver profile in 6 weeks.  3.  Elevated blood pressure without hypertension: Blood pressure continues to be mildly elevated he might ultimately require medication for this.  Disposition:   FU with me in 6 months.  Signed,  Kathlyn Sacramento, MD  01/29/2017 2:39 PM    [place]

## 2017-02-22 ENCOUNTER — Ambulatory Visit: Payer: BLUE CROSS/BLUE SHIELD | Admitting: Cardiovascular Disease

## 2017-03-29 NOTE — Addendum Note (Signed)
Addended by: Doreen Beam on: 03/29/2017 01:31 PM   Modules accepted: Orders

## 2017-03-29 NOTE — Addendum Note (Signed)
Addended by: Doreen Beam on: 03/29/2017 01:27 PM   Modules accepted: Orders

## 2017-03-30 ENCOUNTER — Other Ambulatory Visit: Payer: Self-pay

## 2017-03-30 DIAGNOSIS — R7303 Prediabetes: Secondary | ICD-10-CM

## 2017-03-30 DIAGNOSIS — E559 Vitamin D deficiency, unspecified: Secondary | ICD-10-CM

## 2017-03-30 DIAGNOSIS — Z7289 Other problems related to lifestyle: Secondary | ICD-10-CM

## 2017-03-30 DIAGNOSIS — E785 Hyperlipidemia, unspecified: Secondary | ICD-10-CM

## 2017-03-30 DIAGNOSIS — Z789 Other specified health status: Secondary | ICD-10-CM

## 2017-03-30 DIAGNOSIS — E7800 Pure hypercholesterolemia, unspecified: Secondary | ICD-10-CM

## 2017-03-30 NOTE — Progress Notes (Signed)
Lab visit only with nurse no provider update

## 2017-03-31 LAB — CBC WITH DIFFERENTIAL/PLATELET
Basophils Absolute: 0 10*3/uL (ref 0.0–0.2)
Basos: 1 %
EOS (ABSOLUTE): 0.5 10*3/uL — ABNORMAL HIGH (ref 0.0–0.4)
Eos: 8 %
Hematocrit: 43.1 % (ref 37.5–51.0)
Hemoglobin: 14.6 g/dL (ref 13.0–17.7)
Immature Grans (Abs): 0 10*3/uL (ref 0.0–0.1)
Immature Granulocytes: 0 %
Lymphocytes Absolute: 2 10*3/uL (ref 0.7–3.1)
Lymphs: 33 %
MCH: 30.4 pg (ref 26.6–33.0)
MCHC: 33.9 g/dL (ref 31.5–35.7)
MCV: 90 fL (ref 79–97)
Monocytes Absolute: 0.5 10*3/uL (ref 0.1–0.9)
Monocytes: 8 %
Neutrophils Absolute: 3 10*3/uL (ref 1.4–7.0)
Neutrophils: 50 %
Platelets: 299 10*3/uL (ref 150–379)
RBC: 4.8 x10E6/uL (ref 4.14–5.80)
RDW: 14.6 % (ref 12.3–15.4)
WBC: 6 10*3/uL (ref 3.4–10.8)

## 2017-03-31 LAB — COMPREHENSIVE METABOLIC PANEL
ALT: 28 IU/L (ref 0–44)
AST: 28 IU/L (ref 0–40)
Albumin/Globulin Ratio: 2 (ref 1.2–2.2)
Albumin: 4.9 g/dL (ref 3.5–5.5)
Alkaline Phosphatase: 57 IU/L (ref 39–117)
BUN/Creatinine Ratio: 10 (ref 9–20)
BUN: 12 mg/dL (ref 6–24)
Bilirubin Total: 0.4 mg/dL (ref 0.0–1.2)
CO2: 20 mmol/L (ref 20–29)
Calcium: 9.6 mg/dL (ref 8.7–10.2)
Chloride: 102 mmol/L (ref 96–106)
Creatinine, Ser: 1.16 mg/dL (ref 0.76–1.27)
GFR calc Af Amer: 82 mL/min/{1.73_m2} (ref >59)
GFR calc non Af Amer: 71 mL/min/{1.73_m2} (ref >59)
Globulin, Total: 2.5 g/dL (ref 1.5–4.5)
Glucose: 106 mg/dL — ABNORMAL HIGH (ref 65–99)
Potassium: 4.5 mmol/L (ref 3.5–5.2)
Sodium: 141 mmol/L (ref 134–144)
Total Protein: 7.4 g/dL (ref 6.0–8.5)

## 2017-03-31 LAB — LIPID PANEL W/O CHOL/HDL RATIO
Cholesterol, Total: 202 mg/dL — ABNORMAL HIGH (ref 100–199)
HDL: 61 mg/dL (ref >39)
LDL Calculated: 113 mg/dL — ABNORMAL HIGH (ref 0–99)
Triglycerides: 141 mg/dL (ref 0–149)
VLDL Cholesterol Cal: 28 mg/dL (ref 5–40)

## 2017-03-31 LAB — VITAMIN D 25 HYDROXY (VIT D DEFICIENCY, FRACTURES): Vit D, 25-Hydroxy: 33 ng/mL (ref 30.0–100.0)

## 2017-03-31 LAB — HEMOGLOBIN A1C
Est. average glucose Bld gHb Est-mCnc: 126 mg/dL
Hgb A1c MFr Bld: 6 % — ABNORMAL HIGH (ref 4.8–5.6)

## 2017-03-31 LAB — HEPATIC FUNCTION PANEL: Bilirubin, Direct: 0.12 mg/dL (ref 0.00–0.40)

## 2017-04-06 ENCOUNTER — Other Ambulatory Visit: Payer: Self-pay | Admitting: Adult Health

## 2017-04-06 DIAGNOSIS — E785 Hyperlipidemia, unspecified: Secondary | ICD-10-CM

## 2017-04-06 MED ORDER — ROSUVASTATIN CALCIUM 40 MG PO TABS: 40.0000 mg | ORAL_TABLET | Freq: Every day | ORAL | 0 refills | Status: DC

## 2017-04-06 NOTE — Progress Notes (Signed)
Discussed lipid panel with Dr. Fletcher Anon since he had ordered recheck- agree will increase statin to 40 mg daily and recheck. Goal LDL less than 100. Will recheck in 6 weeks lipid panel and CMP.   Meds ordered this encounter  Medications  . rosuvastatin (CRESTOR) 40 MG tablet    Sig: Take 1 tablet (40 mg total) by mouth daily.    Dispense:  90 tablet    Refill:  0    Recent Results (from the past 2160 hour(s))  VITAMIN D 25 Hydroxy (Vit-D Deficiency, Fractures)     Status: None   Collection Time: 03/30/17  7:42 AM  Result Value Ref Range   Vit D, 25-Hydroxy 33.0 30.0 - 100.0 ng/mL    Comment: Vitamin D deficiency has been defined by the [place] practice guideline as a level of serum 25-OH vitamin D less than 20 ng/mL (1,2). The Endocrine Society went on to further define vitamin D insufficiency as a level between 21 and 29 ng/mL (2). 1. IOM (Institute of Medicine). 2010. Dietary reference    intakes for calcium and D. [place]: The    [employer]. 2. Holick MF, Binkley [place], Bischoff-Ferrari HA, et al.    Evaluation, treatment, and prevention of vitamin D    deficiency: an Endocrine Society clinical practice    guideline. JCEM. 2011 Jul; 96(7):1911-30.   HgB A1c     Status: Abnormal   Collection Time: 03/30/17  7:42 AM  Result Value Ref Range   Hgb A1c MFr Bld 6.0 (H) 4.8 - 5.6 %    Comment:          Prediabetes: 5.7 - 6.4          Diabetes: >6.4          Glycemic control for adults with diabetes: <7.0    Est. average glucose Bld gHb Est-mCnc 126 mg/dL  Comprehensive metabolic panel     Status: Abnormal   Collection Time: 03/30/17  7:42 AM  Result Value Ref Range   Glucose 106 (H) 65 - 99 mg/dL   BUN 12 6 - 24 mg/dL   Creatinine, Ser 1.16 0.76 - 1.27 mg/dL   GFR calc non Af Amer 71 >59 mL/min/1.73   GFR calc Af Amer 82 >59 mL/min/1.73   BUN/Creatinine Ratio 10 9 - 20   Sodium 141 134 - 144 mmol/L   Potassium 4.5 3.5 -  5.2 mmol/L   Chloride 102 96 - 106 mmol/L   CO2 20 20 - 29 mmol/L   Calcium 9.6 8.7 - 10.2 mg/dL   Total Protein 7.4 6.0 - 8.5 g/dL   Albumin 4.9 3.5 - 5.5 g/dL   Globulin, Total 2.5 1.5 - 4.5 g/dL   Albumin/Globulin Ratio 2.0 1.2 - 2.2   Bilirubin Total 0.4 0.0 - 1.2 mg/dL   Alkaline Phosphatase 57 39 - 117 IU/L   AST 28 0 - 40 IU/L   ALT 28 0 - 44 IU/L  CBC w/Diff     Status: Abnormal   Collection Time: 03/30/17  7:42 AM  Result Value Ref Range   WBC 6.0 3.4 - 10.8 x10E3/uL   RBC 4.80 4.14 - 5.80 x10E6/uL   Hemoglobin 14.6 13.0 - 17.7 g/dL   Hematocrit 43.1 37.5 - 51.0 %   MCV 90 79 - 97 fL   MCH 30.4 26.6 - 33.0 pg   MCHC 33.9 31.5 - 35.7 g/dL   RDW 14.6 12.3 - 15.4 %  Platelets 299 150 - 379 x10E3/uL   Neutrophils 50 Not Estab. %   Lymphs 33 Not Estab. %   Monocytes 8 Not Estab. %   Eos 8 Not Estab. %   Basos 1 Not Estab. %   Neutrophils Absolute 3.0 1.4 - 7.0 x10E3/uL   Lymphocytes Absolute 2.0 0.7 - 3.1 x10E3/uL   Monocytes Absolute 0.5 0.1 - 0.9 x10E3/uL   EOS (ABSOLUTE) 0.5 (H) 0.0 - 0.4 x10E3/uL   Basophils Absolute 0.0 0.0 - 0.2 x10E3/uL   Immature Granulocytes 0 Not Estab. %   Immature Grans (Abs) 0.0 0.0 - 0.1 x10E3/uL  Hepatic function panel     Status: None   Collection Time: 03/30/17  7:42 AM  Result Value Ref Range   Bilirubin, Direct 0.12 0.00 - 0.40 mg/dL  Lipid Panel w/o Chol/HDL Ratio     Status: Abnormal   Collection Time: 03/30/17  7:42 AM  Result Value Ref Range   Cholesterol, Total 202 (H) 100 - 199 mg/dL   Triglycerides 141 0 - 149 mg/dL   HDL 61 >39 mg/dL   VLDL Cholesterol Cal 28 5 - 40 mg/dL   LDL Calculated 113 (H) 0 - 99 mg/dL   Will have nursing call patient and review  medication statin dosage increase and need for repeat lab. Keep upcoming appointment with office to review all labs.  Nursing will advise patient to report any unusual muscle aches or cramps, or new symptoms and keep all schedule appointments. Avoid or limit  alcohol.

## 2017-04-13 ENCOUNTER — Encounter: Payer: Self-pay | Admitting: Adult Health

## 2017-04-13 ENCOUNTER — Ambulatory Visit: Payer: Self-pay | Admitting: Adult Health

## 2017-04-13 VITALS — BP 137/82 | HR 96 | Temp 98.0°F | Resp 16 | Ht 74.0 in | Wt 227.0 lb

## 2017-04-13 DIAGNOSIS — Z09 Encounter for follow-up examination after completed treatment for conditions other than malignant neoplasm: Secondary | ICD-10-CM

## 2017-04-13 DIAGNOSIS — R7303 Prediabetes: Secondary | ICD-10-CM

## 2017-04-13 DIAGNOSIS — E785 Hyperlipidemia, unspecified: Secondary | ICD-10-CM

## 2017-04-13 DIAGNOSIS — Z8241 Family history of sudden cardiac death: Secondary | ICD-10-CM

## 2017-04-13 NOTE — Patient Instructions (Addendum)
Preventing High Cholesterol Cholesterol is a waxy, fat-like substance that your body needs in small amounts. Your liver makes all the cholesterol that your body needs. Having high cholesterol (hypercholesterolemia) increases your risk for heart disease and stroke. Extra (excess) cholesterol comes from the food you eat, such as animal-based fat (saturated fat) from meat and some dairy products. High cholesterol can often be prevented with diet and lifestyle changes. If you already have high cholesterol, you can control it with diet and lifestyle changes, as well as medicine. What nutrition changes can be made?  Eat less saturated fat. Foods that contain saturated fat include red meat and some dairy products.  Avoid processed meats, like bacon and lunch meats.  Avoid trans fats, which are found in margarine and some baked goods.  Avoid foods and beverages that have added sugars.  Eat more fruits, vegetables, and whole grains.  Choose healthy sources of protein, such as fish, poultry, and nuts.  Choose healthy sources of fat, such as: ? Nuts. ? Vegetable oils, especially olive oil. ? Fish that have healthy fats (omega-3 fatty acids), such as mackerel or salmon. What lifestyle changes can be made?  Lose weight if you are overweight. Losing 5-10 lb (2.3-4.5 kg) can help prevent or control high cholesterol and reduce your risk for diabetes and high blood pressure. Ask your health care provider to help you with a diet and exercise plan to safely lose weight.  Get enough exercise. Do at least 150 minutes of moderate-intensity exercise each week. ? You could do this in short exercise sessions several times a day, or you could do longer exercise sessions a few times a week. For example, you could take a brisk 10-minute walk or bike ride, 3 times a day, for 5 days a week.  Do not smoke. If you need help quitting, ask your health care provider.  Limit your alcohol intake. If you drink alcohol,  limit alcohol intake to no more than 1 drink a day for nonpregnant women and 2 drinks a day for men. One drink equals 12 oz of beer, 5 oz of wine, or 1 oz of hard liquor. Why are these changes important? If you have high cholesterol, deposits (plaques) may build up on the walls of your blood vessels. Plaques make the arteries narrower and stiffer, which can restrict or block blood flow and cause blood clots to form. This greatly increases your risk for heart attack and stroke. Making diet and lifestyle changes can reduce your risk for these life-threatening conditions. What can I do to lower my risk?  Manage your risk factors for high cholesterol. Talk with your health care provider about all of your risk factors and how to lower your risk.  Manage other conditions that you have, such as diabetes or high blood pressure (hypertension).  Have your cholesterol checked at regular intervals.  Keep all follow-up visits as told by your health care provider. This is important. How is this treated? In addition to diet and lifestyle changes, your health care provider may recommend medicines to help lower cholesterol, such as a medicine to reduce the amount of cholesterol made in your liver. You may need medicine if:  Diet and lifestyle changes do not lower your cholesterol enough.  You have high cholesterol and other risk factors for heart disease or stroke.  Take over-the-counter and prescription medicines only as told by your health care provider. Where to find more information:  American Heart Association: ThisTune.com.pt.jsp  National Heart, Lung, and  Blood Institute: FrenchToiletries.com.cy Summary  High cholesterol increases your risk for heart disease and stroke. By keeping your cholesterol level low, you can reduce your risk for these conditions.  Diet and lifestyle changes  are the most important steps in preventing high cholesterol.  Work with your health care provider to manage your risk factors, and have your blood tested regularly. This information is not intended to replace advice given to you by your health care provider. Make sure you discuss any questions you have with your health care provider. Document Released: 01/10/2015 Document Revised: 09/04/2015 Document Reviewed: 09/04/2015 Elsevier Interactive Patient Education  2018 [employer]. Cholesterol Cholesterol is a fat. Your body needs a small amount of cholesterol. Cholesterol (plaque) may build up in your blood vessels (arteries). That makes you more likely to have a heart attack or stroke. You cannot feel your cholesterol level. Having a blood test is the only way to find out if your level is high. Keep your test results. Work with your doctor to keep your cholesterol at a good level. What do the results mean?  Total cholesterol is how much cholesterol is in your blood.  LDL is bad cholesterol. This is the type that can build up. Try to have low LDL.  HDL is good cholesterol. It cleans your blood vessels and carries LDL away. Try to have high HDL.  Triglycerides are fat that the body can store or burn for energy. What are good levels of cholesterol?  Total cholesterol below 200.  LDL below 100 is good for people who have health risks. LDL below 70 is good for people who have very high risks.  HDL above 40 is good. It is best to have HDL of 60 or higher.  Triglycerides below 150. How can I lower my cholesterol? Diet Follow your diet program as told by your doctor.  Choose fish, white meat chicken, or [place] that is roasted or baked. Try not to eat red meat, fried foods, sausage, or lunch meats.  Eat lots of fresh fruits and vegetables.  Choose whole grains, beans, pasta, potatoes, and cereals.  Choose olive oil, corn oil, or canola oil. Only use small amounts.  Try not to eat  butter, mayonnaise, shortening, or palm kernel oils.  Try not to eat foods with trans fats.  Choose low-fat or nonfat dairy foods. ? Drink skim or nonfat milk. ? Eat low-fat or nonfat yogurt and cheeses. ? Try not to drink whole milk or cream. ? Try not to eat ice cream, egg yolks, or full-fat cheeses.  Healthy desserts include angel food cake, ginger snaps, animal crackers, hard candy, popsicles, and low-fat or nonfat frozen yogurt. Try not to eat pastries, cakes, pies, and cookies.  Exercise Follow your exercise program as told by your doctor.  Be more active. Try gardening, walking, and taking the stairs.  Ask your doctor about ways that you can be more active.  Medicine  Take over-the-counter and prescription medicines only as told by your doctor. This information is not intended to replace advice given to you by your health care provider. Make sure you discuss any questions you have with your health care provider. Document Released: 03/24/2008 Document Revised: 07/28/2015 Document Reviewed: 07/08/2015 Elsevier Interactive Patient Education  2018 [employer]. Prediabetes Eating Plan Prediabetes-also called impaired glucose tolerance or impaired fasting glucose-is a condition that causes blood sugar (blood glucose) levels to be higher than normal. Following a healthy diet can help to keep prediabetes under control. It can also  help to lower the risk of type 2 diabetes and heart disease, which are increased in people who have prediabetes. Along with regular exercise, a healthy diet:  Promotes weight loss.  Helps to control blood sugar levels.  Helps to improve the way that the body uses insulin.  What do I need to know about this eating plan?  Use the glycemic index (GI) to plan your meals. The index tells you how quickly a food will raise your blood sugar. Choose low-GI foods. These foods take a longer time to raise blood sugar.  Pay close attention to the amount of  carbohydrates in the food that you eat. Carbohydrates increase blood sugar levels.  Keep track of how many calories you take in. Eating the right amount of calories will help you to achieve a healthy weight. Losing about 7 percent of your starting weight can help to prevent type 2 diabetes.  You may want to follow a Mediterranean diet. This diet includes a lot of vegetables, lean meats or fish, whole grains, fruits, and healthy oils and fats. What foods can I eat? Grains Whole grains, such as whole-wheat or whole-grain breads, crackers, cereals, and pasta. Unsweetened oatmeal. Bulgur. Barley. Quinoa. Brown rice. Corn or whole-wheat flour tortillas or taco shells. Vegetables Lettuce. Spinach. Peas. Beets. Cauliflower. Cabbage. Broccoli. Carrots. Tomatoes. Squash. Eggplant. Herbs. Peppers. Onions. Cucumbers. Brussels sprouts. Fruits Berries. Bananas. Apples. Oranges. Grapes. Papaya. Mango. Pomegranate. Kiwi. Grapefruit. Cherries. Meats and Other Protein Sources Seafood. Lean meats, such as chicken and [place] or lean cuts of pork and beef. Tofu. Eggs. Nuts. Beans. Dairy Low-fat or fat-free dairy products, such as yogurt, cottage cheese, and cheese. Beverages Water. Tea. Coffee. Sugar-free or diet soda. Seltzer water. Milk. Milk alternatives, such as soy or almond milk. Condiments Mustard. Relish. Low-fat, low-sugar ketchup. Low-fat, low-sugar barbecue sauce. Low-fat or fat-free mayonnaise. Sweets and Desserts Sugar-free or low-fat pudding. Sugar-free or low-fat ice cream and other frozen treats. Fats and Oils Avocado. Walnuts. Olive oil. The items listed above may not be a complete list of recommended foods or beverages. Contact your dietitian for more options. What foods are not recommended? Grains Refined white flour and flour products, such as bread, pasta, snack foods, and cereals. Beverages Sweetened drinks, such as sweet iced tea and soda. Sweets and Desserts Baked goods, such as  cake, cupcakes, pastries, cookies, and cheesecake. The items listed above may not be a complete list of foods and beverages to avoid. Contact your dietitian for more information. This information is not intended to replace advice given to you by your health care provider. Make sure you discuss any questions you have with your health care provider. Document Released: 05/12/2014 Document Revised: 06/03/2015 Document Reviewed: 01/21/2014 Elsevier Interactive Patient Education  2017 [employer]. Rosuvastatin Tablets What is this medicine? ROSUVASTATIN (roe SOO va sta tin) is known as a HMG-CoA reductase inhibitor or 'statin'. It lowers cholesterol and triglycerides in the blood. This drug may also reduce the risk of heart attack, stroke, or other health problems in patients with risk factors for heart disease. Diet and lifestyle changes are often used with this drug. This medicine may be used for other purposes; ask your health care provider or pharmacist if you have questions. COMMON BRAND NAME(S): Crestor What should I tell my health care provider before I take this medicine? They need to know if you have any of these conditions: -frequently drink alcoholic beverages -kidney disease -liver disease -muscle aches or weakness -other medical condition -an unusual or  allergic reaction to rosuvastatin, other medicines, foods, dyes, or preservatives -pregnant or trying to get pregnant -breast-feeding How should I use this medicine? Take this medicine by mouth with a glass of water. Follow the directions on the prescription label. Do not cut, crush or chew this medicine. You can take this medicine with or without food. Take your doses at regular intervals. Do not take your medicine more often than directed. Talk to your pediatrician regarding the use of this medicine in children. While this drug may be prescribed for children as young as 71 years old for selected conditions, precautions do  apply. Overdosage: If you think you have taken too much of this medicine contact a poison control center or emergency room at once. NOTE: This medicine is only for you. Do not share this medicine with others. What if I miss a dose? If you miss a dose, take it as soon as you can. Do not take 2 doses within 12 hours of each other. If there are less than 12 hours until your next dose, take only that dose. Do not take double or extra doses. What may interact with this medicine? Do not take this medicine with any of the following medications: -herbal medicines like red yeast rice This medicine may also interact with the following medications: -alcohol -antacids containing aluminum hydroxide or magnesium hydroxide -cyclosporine -other medicines for high cholesterol -some medicines for HIV infection -warfarin This list may not describe all possible interactions. Give your health care provider a list of all the medicines, herbs, non-prescription drugs, or dietary supplements you use. Also tell them if you smoke, drink alcohol, or use illegal drugs. Some items may interact with your medicine. What should I watch for while using this medicine? Visit your doctor or health care professional for regular check-ups. You may need regular tests to make sure your liver is working properly. Tell your doctor or health care professional right away if you get any unexplained muscle pain, tenderness, or weakness, especially if you also have a fever and tiredness. Your doctor or health care professional may tell you to stop taking this medicine if you develop muscle problems. If your muscle problems do not go away after stopping this medicine, contact your health care professional. This medicine may affect blood sugar levels. If you have diabetes, check with your doctor or health care professional before you change your diet or the dose of your diabetic medicine. Avoid taking antacids containing aluminum, calcium or  magnesium within 2 hours of taking this medicine. This drug is only part of a total heart-health program. Your doctor or a dietician can suggest a low-cholesterol and low-fat diet to help. Avoid alcohol and smoking, and keep a proper exercise schedule. Do not use this drug if you are pregnant or breast-feeding. Serious side effects to an unborn child or to an infant are possible. Talk to your doctor or pharmacist for more information. What side effects may I notice from receiving this medicine? Side effects that you should report to your doctor or health care professional as soon as possible: -allergic reactions like skin rash, itching or hives, swelling of the face, lips, or tongue -dark urine -fever -joint pain -muscle cramps, pain -redness, blistering, peeling or loosening of the skin, including inside the mouth -trouble passing urine or change in the amount of urine -unusually weak or tired -yellowing of the eyes or skin Side effects that usually do not require medical attention (report to your doctor or health care professional if  they continue or are bothersome): -constipation -heartburn -nausea -stomach gas, pain, upset This list may not describe all possible side effects. Call your doctor for medical advice about side effects. You may report side effects to FDA at 1-800-FDA-1088. Where should I keep my medicine? Keep out of the reach of children. Store at room temperature between 20 and 25 degrees C (68 and 77 degrees F). Keep container tightly closed (protect from moisture). Throw away any unused medicine after the expiration date. NOTE: This sheet is a summary. It may not cover all possible information. If you have questions about this medicine, talk to your doctor, pharmacist, or health care provider.  2018 Elsevier/Gold Standard (2014-06-11 13:33:08)

## 2017-04-13 NOTE — Progress Notes (Signed)
Subjective:     Patient ID: Micheal Holland, male   DOB: [birth year], 57 y.o.   MRN: [id number]  HPI   Vitals:   04/13/17 0956  BP: 137/82  Pulse: 96  Resp: 16  Temp: 98 F (36.7 C)  SpO2: 99%    Patient is 57 year old male in no acute distress who comes to the clinic for review of most recent labs. He denies any symptoms or concerns at this time. He reports he has been trying to follow a lower cholesterol and glycemic diet. Provider offered a nutritional consult with nutritionist available at [facility]. He declines as he has met previously.   Cholesterol is improving on Rosuvastatin 20 mg was recently increased to 40 mg daily for a goal LDL less than 100. He is also taking Aspirin 81 mg daily.  He is seeing Dr. Fletcher Anon Cardiology for regular follow ups due to abnormal coronary calcium score and early cardiac death in family. He denies any fatigue, myalgias, or pain. He has strong family history of premature coronary artery disease.  His father died at the age of 77 of myocardial infarction.  Mother died at the age of 33 of myocardial infarction in his brother had multiple strokes. CT calcium score 54 was abnormal per cardiology note. Echocardiogram per cardiology was normal LV systolic function and no significant valvular abnormalities.    Patient  denies any fever, body aches,chills, rash, chest pain, shortness of breath, nausea, vomiting, or diarrhea.   He denies any concerns or questions at this time.    Review of Systems  Constitutional: Negative.   HENT: Negative.   Respiratory: Negative.   Cardiovascular: Negative.   Gastrointestinal: Negative.   Musculoskeletal: Negative.   Skin: Negative.   Neurological: Negative.   Hematological: Negative.   Psychiatric/Behavioral: Negative.        Objective:   Physical Exam  Constitutional: He is oriented to person, place, and time. Vital signs are normal. He appears well-developed and well-nourished.  Non-toxic appearance. He does  not have a sickly appearance. He does not appear ill. No distress.  Patient is alert and oriented and responsive to questions Engages in eye contact with provider. Speaks in full sentences without any pauses without any shortness of breath.    HENT:  Head: Normocephalic and atraumatic.  Nose: Nose normal.  Mouth/Throat: Oropharynx is clear and moist. No oropharyngeal exudate.  Eyes: Pupils are equal, round, and reactive to light. Conjunctivae and EOM are normal. Right eye exhibits no discharge. Left eye exhibits no discharge. No scleral icterus.  Neck: Normal range of motion. Neck supple.  Cardiovascular: Normal rate, regular rhythm, normal heart sounds and intact distal pulses. Exam reveals no gallop and no friction rub.  No murmur heard. Pulmonary/Chest: Effort normal and breath sounds normal. No respiratory distress. He has no wheezes. He has no rales. He exhibits no tenderness.  Abdominal: Soft. Bowel sounds are normal. He exhibits no distension and no mass. There is no tenderness. There is no rebound and no guarding.  Musculoskeletal: Normal range of motion. He exhibits no edema, tenderness or deformity.  Patient moves on and off of exam table and in room without difficulty. Gait is normal in hall and in room. Patient is oriented to person place time and situation. Patient answers questions appropriately and engages in conversation.   Neurological: He is alert and oriented to person, place, and time. He has normal reflexes. He displays normal reflexes. Coordination normal.  Skin: Skin is warm and dry.  No rash noted. He is not diaphoretic. No erythema. No pallor.  Psychiatric: He has a normal mood and affect. His behavior is normal. Judgment and thought content normal.  Vitals reviewed.      Assessment:     Follow up  Prediabetes  Hyperlipidemia, unspecified hyperlipidemia type  Family history of death due to heart problem at 28 years of age or younger      Plan:     He is  taking 2,000 to 4,000 International units daily of Vitamin D 3 and will continue. Vitamin D level is normal now.   Continue medications as below: Call if any refills needed. Denies need for refills currently.   Current Outpatient Medications:  .  aspirin EC 81 MG tablet, Take 1 tablet (81 mg total) by mouth daily., Disp: 90 tablet, Rfl: 3 .  rosuvastatin (CRESTOR) 40 MG tablet, Take 1 tablet (40 mg total) by mouth daily., Disp: 90 tablet, Rfl: 0 .  VITAMIN D, ERGOCALCIFEROL, PO, Take by mouth 2 (two) times daily., Disp: , Rfl:    Most recent labs reviewed and copy given.   Reviewed AVS handout.  Advised  to monitor blood pressure reading and keep log/ call office with any elevated readings per parameters.   Labs 1-2 weeks prior to physical. Fasting. - patient is aware to schedule physical and labs.  Physical is due in October.  I have already ordered this as a future order in the computer. Orders Placed This Encounter  Procedures  . CMP12+LP+TP+TSH+6AC+PSA+CBC.  Marland Kitchen VITAMIN D 25 Hydroxy (Vit-D Deficiency, Fractures)  . HgB A1c    Advised patient call the office or your primary care doctor for an appointment as needed.  Advised ER or urgent Care if after hours or on weekend. Call 911 for emergency symptoms at any time.Patinet verbalized understanding of all instructions given/reviewed and treatment plan and has no further questions or concerns at this time.

## 2017-05-17 ENCOUNTER — Other Ambulatory Visit: Payer: Self-pay

## 2017-05-17 DIAGNOSIS — Z09 Encounter for follow-up examination after completed treatment for conditions other than malignant neoplasm: Secondary | ICD-10-CM

## 2017-05-17 DIAGNOSIS — E785 Hyperlipidemia, unspecified: Secondary | ICD-10-CM

## 2017-05-18 ENCOUNTER — Telehealth: Payer: Self-pay | Admitting: Adult Health

## 2017-05-18 DIAGNOSIS — Z0000 Encounter for general adult medical examination without abnormal findings: Secondary | ICD-10-CM

## 2017-05-18 DIAGNOSIS — E785 Hyperlipidemia, unspecified: Secondary | ICD-10-CM

## 2017-05-18 LAB — HEMOGLOBIN A1C
Est. average glucose Bld gHb Est-mCnc: 126 mg/dL
Hgb A1c MFr Bld: 6 % — ABNORMAL HIGH (ref 4.8–5.6)

## 2017-05-18 LAB — CMP12+LP+TP+TSH+6AC+PSA+CBC…
ALT: 24 IU/L (ref 0–44)
AST: 26 IU/L (ref 0–40)
Albumin/Globulin Ratio: 2.1 (ref 1.2–2.2)
Albumin: 4.6 g/dL (ref 3.5–5.5)
Alkaline Phosphatase: 49 IU/L (ref 39–117)
BUN/Creatinine Ratio: 14 (ref 9–20)
BUN: 15 mg/dL (ref 6–24)
Basophils Absolute: 0 10*3/uL (ref 0.0–0.2)
Basos: 1 %
Bilirubin Total: 0.4 mg/dL (ref 0.0–1.2)
Calcium: 9.2 mg/dL (ref 8.7–10.2)
Chloride: 104 mmol/L (ref 96–106)
Chol/HDL Ratio: 3.8 ratio (ref 0.0–5.0)
Cholesterol, Total: 214 mg/dL — ABNORMAL HIGH (ref 100–199)
Creatinine, Ser: 1.05 mg/dL (ref 0.76–1.27)
EOS (ABSOLUTE): 0.4 10*3/uL (ref 0.0–0.4)
Eos: 8 %
Estimated CHD Risk: 0.7 times avg. (ref 0.0–1.0)
Free Thyroxine Index: 1.6 (ref 1.2–4.9)
GFR calc Af Amer: 93 mL/min/{1.73_m2} (ref >59)
GFR calc non Af Amer: 80 mL/min/{1.73_m2} (ref >59)
GGT: 45 IU/L (ref 0–65)
Globulin, Total: 2.2 g/dL (ref 1.5–4.5)
Glucose: 113 mg/dL — ABNORMAL HIGH (ref 65–99)
HDL: 56 mg/dL (ref >39)
Hematocrit: 43.6 % (ref 37.5–51.0)
Hemoglobin: 14.5 g/dL (ref 13.0–17.7)
Immature Grans (Abs): 0 10*3/uL (ref 0.0–0.1)
Immature Granulocytes: 0 %
Iron: 86 ug/dL (ref 38–169)
LDH: 162 IU/L (ref 121–224)
LDL Calculated: 130 mg/dL — ABNORMAL HIGH (ref 0–99)
Lymphocytes Absolute: 1.6 10*3/uL (ref 0.7–3.1)
Lymphs: 30 %
MCH: 30 pg (ref 26.6–33.0)
MCHC: 33.3 g/dL (ref 31.5–35.7)
MCV: 90 fL (ref 79–97)
Monocytes Absolute: 0.5 10*3/uL (ref 0.1–0.9)
Monocytes: 10 %
Neutrophils Absolute: 2.7 10*3/uL (ref 1.4–7.0)
Neutrophils: 51 %
Phosphorus: 3.7 mg/dL (ref 2.5–4.5)
Platelets: 271 10*3/uL (ref 150–379)
Potassium: 4.3 mmol/L (ref 3.5–5.2)
Prostate Specific Ag, Serum: 0.4 ng/mL (ref 0.0–4.0)
RBC: 4.83 x10E6/uL (ref 4.14–5.80)
RDW: 14.5 % (ref 12.3–15.4)
Sodium: 138 mmol/L (ref 134–144)
T3 Uptake Ratio: 30 % (ref 24–39)
T4, Total: 5.3 ug/dL (ref 4.5–12.0)
TSH: 2.11 u[IU]/mL (ref 0.450–4.500)
Total Protein: 6.8 g/dL (ref 6.0–8.5)
Triglycerides: 138 mg/dL (ref 0–149)
Uric Acid: 5.9 mg/dL (ref 3.7–8.6)
VLDL Cholesterol Cal: 28 mg/dL (ref 5–40)
WBC: 5.2 10*3/uL (ref 3.4–10.8)

## 2017-05-18 LAB — HEPATIC FUNCTION PANEL: Bilirubin, Direct: 0.13 mg/dL (ref 0.00–0.40)

## 2017-05-18 LAB — VITAMIN D 25 HYDROXY (VIT D DEFICIENCY, FRACTURES): Vit D, 25-Hydroxy: 32.7 ng/mL (ref 30.0–100.0)

## 2017-05-18 NOTE — Telephone Encounter (Addendum)
Called patient to discuss lab results.  Liver and kidney function stable with increase in Crestor to 40 mg one month ago.  Most recent cholesterol 214 - increased from one month ago. Recheck in 6 months and physical 1- 2 weeks after lab  and continue current medication regimen.  Current Outpatient Medications:  .  aspirin EC 81 MG tablet, Take 1 tablet (81 mg total) by mouth daily., Disp: 90 tablet, Rfl: 3 .  rosuvastatin (CRESTOR) 40 MG tablet, Take 1 tablet (40 mg total) by mouth daily., Disp: 90 tablet, Rfl: 0 .  VITAMIN D, ERGOCALCIFEROL, PO, Take by mouth 2 (two) times daily., Disp: , Rfl: Discuss low cholesterol, diet and exercise. Heart healthy diet. Low glycemic. Hemoglobin A1c - prediabetic range.Discussed with patient Crestor and he does report that he increased dosage to Crestor 40 mg daily - but reports he has missed some days of his medication. Discussed taking daily- setting medication reminder on phone  Etc. and recheck labs ordered for 6 month.   Follow up as needed and if any symptoms change or worsen.   Advised patient call the office or your primary care doctor for an appointment if no improvement within 72 hours or if any symptoms change or worsen at any time  Advised ER or urgent Care if after hours or on weekend. Call 911 for emergency symptoms at any time.Patinet verbalized understanding of all instructions given/reviewed and treatment plan and has no further questions or concerns at this time.    Patient verbalized understanding of all instructions given and denies any further questions at this time.                                  Current      1 mon. Ago  Oct. 18 Cholesterol, Total 100 - 199 mg/dL 214High   202High     285High    Triglycerides 0 - 149 mg/dL 138  141    136   HDL >39 mg/dL 56  61    63   VLDL Cholesterol Cal 5 - 40 mg/dL 28  28    27    LDL Calculated 0 - 99 mg/dL 130High   113High

## 2017-05-18 NOTE — Progress Notes (Signed)
Reviewed with patient - he has missed doses of Crestor. To continue current dose and recheck as instructed. 6 months.

## 2017-10-17 ENCOUNTER — Other Ambulatory Visit: Payer: Self-pay

## 2017-10-18 ENCOUNTER — Other Ambulatory Visit: Payer: Self-pay

## 2017-10-18 DIAGNOSIS — Z0000 Encounter for general adult medical examination without abnormal findings: Secondary | ICD-10-CM

## 2017-10-18 DIAGNOSIS — E785 Hyperlipidemia, unspecified: Secondary | ICD-10-CM

## 2017-10-19 LAB — CMP12+LP+TP+TSH+6AC+PSA+CBC…
ALT: 26 IU/L (ref 0–44)
AST: 28 IU/L (ref 0–40)
Albumin/Globulin Ratio: 2.3 — ABNORMAL HIGH (ref 1.2–2.2)
Albumin: 4.9 g/dL (ref 3.5–5.5)
Alkaline Phosphatase: 46 IU/L (ref 39–117)
BUN/Creatinine Ratio: 11 (ref 9–20)
BUN: 13 mg/dL (ref 6–24)
Basophils Absolute: 0.1 10*3/uL (ref 0.0–0.2)
Basos: 1 %
Bilirubin Total: 0.6 mg/dL (ref 0.0–1.2)
Calcium: 9.6 mg/dL (ref 8.7–10.2)
Chloride: 104 mmol/L (ref 96–106)
Chol/HDL Ratio: 3.5 ratio (ref 0.0–5.0)
Cholesterol, Total: 211 mg/dL — ABNORMAL HIGH (ref 100–199)
Creatinine, Ser: 1.18 mg/dL (ref 0.76–1.27)
EOS (ABSOLUTE): 0.3 10*3/uL (ref 0.0–0.4)
Eos: 5 %
Estimated CHD Risk: 0.5 times avg. (ref 0.0–1.0)
Free Thyroxine Index: 1.8 (ref 1.2–4.9)
GFR calc Af Amer: 80 mL/min/{1.73_m2} (ref >59)
GFR calc non Af Amer: 69 mL/min/{1.73_m2} (ref >59)
GGT: 72 IU/L — ABNORMAL HIGH (ref 0–65)
Globulin, Total: 2.1 g/dL (ref 1.5–4.5)
Glucose: 98 mg/dL (ref 65–99)
HDL: 61 mg/dL (ref >39)
Hematocrit: 43.3 % (ref 37.5–51.0)
Hemoglobin: 14.4 g/dL (ref 13.0–17.7)
Immature Grans (Abs): 0 10*3/uL (ref 0.0–0.1)
Immature Granulocytes: 0 %
Iron: 97 ug/dL (ref 38–169)
LDH: 164 IU/L (ref 121–224)
LDL Calculated: 123 mg/dL — ABNORMAL HIGH (ref 0–99)
Lymphocytes Absolute: 1.6 10*3/uL (ref 0.7–3.1)
Lymphs: 32 %
MCH: 30 pg (ref 26.6–33.0)
MCHC: 33.3 g/dL (ref 31.5–35.7)
MCV: 90 fL (ref 79–97)
Monocytes Absolute: 0.5 10*3/uL (ref 0.1–0.9)
Monocytes: 9 %
Neutrophils Absolute: 2.7 10*3/uL (ref 1.4–7.0)
Neutrophils: 53 %
Phosphorus: 4.1 mg/dL (ref 2.5–4.5)
Platelets: 287 10*3/uL (ref 150–450)
Potassium: 4.3 mmol/L (ref 3.5–5.2)
Prostate Specific Ag, Serum: 0.3 ng/mL (ref 0.0–4.0)
RBC: 4.8 x10E6/uL (ref 4.14–5.80)
RDW: 13.7 % (ref 12.3–15.4)
Sodium: 143 mmol/L (ref 134–144)
T3 Uptake Ratio: 29 % (ref 24–39)
T4, Total: 6.1 ug/dL (ref 4.5–12.0)
TSH: 2.79 u[IU]/mL (ref 0.450–4.500)
Total Protein: 7 g/dL (ref 6.0–8.5)
Triglycerides: 136 mg/dL (ref 0–149)
Uric Acid: 7.3 mg/dL (ref 3.7–8.6)
VLDL Cholesterol Cal: 27 mg/dL (ref 5–40)
WBC: 5.1 10*3/uL (ref 3.4–10.8)

## 2017-10-19 LAB — HEMOGLOBIN A1C
Est. average glucose Bld gHb Est-mCnc: 126 mg/dL
Hgb A1c MFr Bld: 6 % — ABNORMAL HIGH (ref 4.8–5.6)

## 2017-10-22 NOTE — Progress Notes (Signed)
Will discuss pre diabetes - diet / lifestyle modifications aggressive and refer to nutritionist for hyperlipidemia as well as prediabetes.  GGT - elevated he does have history of alcohol.  He is on Rosuvastatin max 40 mg daily - ast last visit was inconsistent taking. Will verify at next viist.  With GGT elevation any further recommendations for this primary patient?  I did send him to cardiology for pertinent family history early cardiac death- has abnormal Ca score and Dr. Fletcher Anon following.

## 2017-10-25 ENCOUNTER — Ambulatory Visit: Payer: Self-pay | Admitting: Adult Health

## 2017-10-26 ENCOUNTER — Ambulatory Visit: Payer: Self-pay | Admitting: Adult Health

## 2017-11-07 ENCOUNTER — Ambulatory Visit: Payer: Self-pay | Admitting: Adult Health

## 2017-11-07 ENCOUNTER — Encounter: Payer: Self-pay | Admitting: Adult Health

## 2017-11-07 ENCOUNTER — Other Ambulatory Visit: Payer: Self-pay

## 2017-11-07 VITALS — BP 138/82 | HR 93 | Temp 98.5°F | Resp 16 | Ht 74.0 in | Wt 229.0 lb

## 2017-11-07 DIAGNOSIS — R931 Abnormal findings on diagnostic imaging of heart and coronary circulation: Secondary | ICD-10-CM

## 2017-11-07 DIAGNOSIS — R82998 Other abnormal findings in urine: Secondary | ICD-10-CM

## 2017-11-07 DIAGNOSIS — Z8241 Family history of sudden cardiac death: Secondary | ICD-10-CM

## 2017-11-07 DIAGNOSIS — E785 Hyperlipidemia, unspecified: Secondary | ICD-10-CM

## 2017-11-07 DIAGNOSIS — R7303 Prediabetes: Secondary | ICD-10-CM

## 2017-11-07 DIAGNOSIS — Z0000 Encounter for general adult medical examination without abnormal findings: Secondary | ICD-10-CM

## 2017-11-07 DIAGNOSIS — R311 Benign essential microscopic hematuria: Secondary | ICD-10-CM

## 2017-11-07 LAB — POCT URINALYSIS DIPSTICK
Appearance: NORMAL
Glucose, UA: NEGATIVE
Nitrite, UA: NEGATIVE
Protein, UA: NEGATIVE
Spec Grav, UA: 1.015 (ref 1.010–1.025)
Urobilinogen, UA: 0.2 E.U./dL
pH, UA: 6 (ref 5.0–8.0)

## 2017-11-07 MED ORDER — ROSUVASTATIN CALCIUM 40 MG PO TABS: 40.0000 mg | ORAL_TABLET | Freq: Every day | ORAL | 0 refills | Status: DC

## 2017-11-07 MED ORDER — ASPIRIN EC 81 MG PO TBEC: 81.0000 mg | DELAYED_RELEASE_TABLET | Freq: Every day | ORAL | 3 refills | Status: DC

## 2017-11-07 NOTE — Patient Instructions (Addendum)
Erectile Dysfunction Erectile dysfunction (ED) is the inability to get or keep an erection in order to have sexual intercourse. Erectile dysfunction may include:  Inability to get an erection.  Lack of enough hardness of the erection to allow penetration.  Loss of the erection before sex is finished. What are the causes? This condition may be caused by:  Certain medicines, such as:  Pain relievers.  Antihistamines.  Antidepressants.  Blood pressure medicines.  Water pills (diuretics).  Ulcer medicines.  Muscle relaxants.  Drugs.  Excessive drinking.  Psychological causes, such as:  Anxiety.  Depression.  Sadness.  Exhaustion.  Performance fear.  Stress.  Physical causes, such as:  Artery problems. This may include diabetes, smoking, liver disease, or atherosclerosis.  High blood pressure.  Hormonal problems, such as low testosterone.  Obesity.  Nerve problems. This may include back or pelvic injuries, diabetes mellitus, multiple sclerosis, or Parkinson disease. What are the signs or symptoms? Symptoms of this condition include:  Inability to get an erection.  Lack of enough hardness of the erection to allow penetration.  Loss of the erection before sex is finished.  Normal erections at some times, but with frequent unsatisfactory episodes.  Low sexual satisfaction in either partner due to erection problems.  A curved penis occurring with erection. The curve may cause pain or the penis may be too curved to allow for intercourse.  Never having nighttime erections. How is this diagnosed? This condition is often diagnosed by:  Performing a physical exam to find other diseases or specific problems with the penis.  Asking you detailed questions about the problem.  Performing blood tests to check for diabetes mellitus or to measure hormone levels.  Performing other tests to check for underlying health conditions.  Performing an ultrasound  exam to check for scarring.  Performing a test to check blood flow to the penis.  Doing a sleep study at home to measure nighttime erections. How is this treated? This condition may be treated by:  Medicine taken by mouth to help you achieve an erection (oral medicine).  Hormone replacement therapy to replace low testosterone levels.  Medicine that is injected into the penis. Your health care provider may instruct you how to give yourself these injections at home.  Vacuum pump. This is a pump with a ring on it. The pump and ring are placed on the penis and used to create pressure that helps the penis become erect.  Penile implant surgery. In this procedure, you may receive:  An inflatable implant. This consists of cylinders, a pump, and a reservoir. The cylinders can be inflated with a fluid that helps to create an erection, and they can be deflated after intercourse.  A semi-rigid implant. This consists of two silicone rubber rods. The rods provide some rigidity. They are also flexible, so the penis can both curve downward in its normal position and become straight for sexual intercourse.  Blood vessel surgery, to improve blood flow to the penis. During this procedure, a blood vessel from a different part of the body is placed into the penis to allow blood to flow around (bypass) damaged or blocked blood vessels.  Lifestyle changes, such as exercising more, losing weight, and quitting smoking. Follow these instructions at home: Medicines   Take over-the-counter and prescription medicines only as told by your health care provider. Do not increase the dosage without first discussing it with your health care provider.  If you are using self-injections, perform injections as directed by your   as directed by your health care provider. Make sure to avoid any veins that are on the surface of the penis. After giving an injection, apply pressure to the injection site for 5 minutes. General  instructions  Exercise regularly, as directed by your health care provider. Work with your health care provider to lose weight, if needed.  Do not use any products that contain nicotine or tobacco, such as cigarettes and e-cigarettes. If you need help quitting, ask your health care provider.  Before using a vacuum pump, read the instructions that come with the pump and discuss any questions with your health care provider.  Keep all follow-up visits as told by your health care provider. This is important. Contact a health care provider if:  You feel nauseous.  You vomit. Get help right away if:  You are taking oral or injectable medicines and you have an erection that lasts longer than 4 hours. If your health care provider is unavailable, go to the nearest emergency room for evaluation. An erection that lasts much longer than 4 hours can result in permanent damage to your penis.  You have severe pain in your groin or abdomen.  You develop redness or severe swelling of your penis.  You have redness spreading up into your groin or lower abdomen.  You are unable to urinate.  You experience chest pain or a rapid heart beat (palpitations) after taking oral medicines. Summary  Erectile dysfunction (ED) is the inability to get or keep an erection during sexual intercourse. This problem can usually be treated successfully.  This condition is diagnosed based on a physical exam, your symptoms, and tests to determine the cause. Treatment varies depending on the cause, and may include medicines, hormone therapy, surgery, or vacuum pump.  You may need follow-up visits to make sure that you are using your medicines or devices correctly.  Get help right away if you are taking or injecting medicines and you have an erection that lasts longer than 4 hours. This information is not intended to replace advice given to you by your health care provider. Make sure you discuss any questions you have with  your health care provider. Document Released: 12/24/1999 Document Revised: 01/12/2016 Document Reviewed: 01/12/2016 Elsevier Interactive Patient Education  2017 [employer]  Cholesterol Cholesterol is a fat. Your body needs a small amount of cholesterol. Cholesterol (plaque) may build up in your blood vessels (arteries). That makes you more likely to have a heart attack or stroke. You cannot feel your cholesterol level. Having a blood test is the only way to find out if your level is high. Keep your test results. Work with your doctor to keep your cholesterol at a good level. What do the results mean?  Total cholesterol is how much cholesterol is in your blood.  LDL is bad cholesterol. This is the type that can build up. Try to have low LDL.  HDL is good cholesterol. It cleans your blood vessels and carries LDL away. Try to have high HDL.  Triglycerides are fat that the body can store or burn for energy. What are good levels of cholesterol?  Total cholesterol below 200.  LDL below 100 is good for people who have health risks. LDL below 70 is good for people who have very high risks.  HDL above 40 is good. It is best to have HDL of 60 or higher.  Triglycerides below 150. How can I lower my cholesterol? Diet Follow your diet program as told by your  doctor.  Choose fish, white meat chicken, or [place] that is roasted or baked. Try not to eat red meat, fried foods, sausage, or lunch meats.  Eat lots of fresh fruits and vegetables.  Choose whole grains, beans, pasta, potatoes, and cereals.  Choose olive oil, corn oil, or canola oil. Only use small amounts.  Try not to eat butter, mayonnaise, shortening, or palm kernel oils.  Try not to eat foods with trans fats.  Choose low-fat or nonfat dairy foods. ? Drink skim or nonfat milk. ? Eat low-fat or nonfat yogurt and cheeses. ? Try not to drink whole milk or cream. ? Try not to eat ice cream, egg yolks, or full-fat  cheeses.  Healthy desserts include angel food cake, ginger snaps, animal crackers, hard candy, popsicles, and low-fat or nonfat frozen yogurt. Try not to eat pastries, cakes, pies, and cookies.  Exercise Follow your exercise program as told by your doctor.  Be more active. Try gardening, walking, and taking the stairs.  Ask your doctor about ways that you can be more active.  Medicine  Take over-the-counter and prescription medicines only as told by your doctor. This information is not intended to replace advice given to you by your health care provider. Make sure you discuss any questions you have with your health care provider. Document Released: 03/24/2008 Document Revised: 07/28/2015 Document Reviewed: 07/08/2015 Elsevier Interactive Patient Education  2018 [place] Maintenance, Male A healthy lifestyle and preventive care is important for your health and wellness. Ask your health care provider about what schedule of regular examinations is right for you. What should I know about weight and diet? Eat a Healthy Diet  Eat plenty of vegetables, fruits, whole grains, low-fat dairy products, and lean protein.  Do not eat a lot of foods high in solid fats, added sugars, or salt.  Maintain a Healthy Weight Regular exercise can help you achieve or maintain a healthy weight. You should:  Do at least 150 minutes of exercise each week. The exercise should increase your heart rate and make you sweat (moderate-intensity exercise).  Do strength-training exercises at least twice a week.  Watch Your Levels of Cholesterol and Blood Lipids  Have your blood tested for lipids and cholesterol every 5 years starting at 55 years of age. If you are at high risk for heart disease, you should start having your blood tested when you are 55 years old. You may need to have your cholesterol levels checked more often if: ? Your lipid or cholesterol levels are high. ? You are older than 55  years of age. ? You are at high risk for heart disease.  What should I know about cancer screening? Many types of cancers can be detected early and may often be prevented. Lung Cancer  You should be screened every year for lung cancer if: ? You are a current smoker who has smoked for at least 30 years. ? You are a former smoker who has quit within the past 15 years.  Talk to your health care provider about your screening options, when you should start screening, and how often you should be screened.  Colorectal Cancer  Routine colorectal cancer screening usually begins at 55 years of age and should be repeated every 5-10 years until you are 55 years old. You may need to be screened more often if early forms of precancerous polyps or small growths are found. Your health care provider may recommend screening at an earlier age if you  have risk factors for colon cancer.  Your health care provider may recommend using home test kits to check for hidden blood in the stool.  A small camera at the end of a tube can be used to examine your colon (sigmoidoscopy or colonoscopy). This checks for the earliest forms of colorectal cancer.  Prostate and Testicular Cancer  Depending on your age and overall health, your health care provider may do certain tests to screen for prostate and testicular cancer.  Talk to your health care provider about any symptoms or concerns you have about testicular or prostate cancer.  Skin Cancer  Check your skin from head to toe regularly.  Tell your health care provider about any new moles or changes in moles, especially if: ? There is a change in a mole's size, shape, or color. ? You have a mole that is larger than a pencil eraser.  Always use sunscreen. Apply sunscreen liberally and repeat throughout the day.  Protect yourself by wearing long sleeves, pants, a wide-brimmed hat, and sunglasses when outside.  What should I know about heart disease, diabetes, and  high blood pressure?  If you are 43-47 years of age, have your blood pressure checked every 3-5 years. If you are 7 years of age or older, have your blood pressure checked every year. You should have your blood pressure measured twice-once when you are at a hospital or clinic, and once when you are not at a hospital or clinic. Record the average of the two measurements. To check your blood pressure when you are not at a hospital or clinic, you can use: ? An automated blood pressure machine at a pharmacy. ? A home blood pressure monitor.  Talk to your health care provider about your target blood pressure.  If you are between 51-68 years old, ask your health care provider if you should take aspirin to prevent heart disease.  Have regular diabetes screenings by checking your fasting blood sugar level. ? If you are at a normal weight and have a low risk for diabetes, have this test once every three years after the age of 27. ? If you are overweight and have a high risk for diabetes, consider being tested at a younger age or more often.  A one-time screening for abdominal aortic aneurysm (AAA) by ultrasound is recommended for men aged 10-75 years who are current or former smokers. What should I know about preventing infection? Hepatitis B If you have a higher risk for hepatitis B, you should be screened for this virus. Talk with your health care provider to find out if you are at risk for hepatitis B infection. Hepatitis C Blood testing is recommended for:  Everyone born from [birth year] through 1963.  Anyone with known risk factors for hepatitis C.  Sexually Transmitted Diseases (STDs)  You should be screened each year for STDs including gonorrhea and chlamydia if: ? You are sexually active and are younger than 55 years of age. ? You are older than 55 years of age and your health care provider tells you that you are at risk for this type of infection. ? Your sexual activity has changed since you  were last screened and you are at an increased risk for chlamydia or gonorrhea. Ask your health care provider if you are at risk.  Talk with your health care provider about whether you are at high risk of being infected with HIV. Your health care provider may recommend a prescription medicine to help prevent HIV  infection.  What else can I do?  Schedule regular health, dental, and eye exams.  Stay current with your vaccines (immunizations).  Do not use any tobacco products, such as cigarettes, chewing tobacco, and e-cigarettes. If you need help quitting, ask your health care provider.  Limit alcohol intake to no more than 2 drinks per day. One drink equals 12 ounces of beer, 5 ounces of wine, or 1 ounces of hard liquor.  Do not use street drugs.  Do not share needles.  Ask your health care provider for help if you need support or information about quitting drugs.  Tell your health care provider if you often feel depressed.  Tell your health care provider if you have ever been abused or do not feel safe at home. This information is not intended to replace advice given to you by your health care provider. Make sure you discuss any questions you have with your health care provider. Document Released: 06/24/2007 Document Revised: 08/25/2015 Document Reviewed: 09/29/2014 Elsevier Interactive Patient Education  2018 [employer].   High Cholesterol High cholesterol is a condition in which the blood has high levels of a white, waxy, fat-like substance (cholesterol). The human body needs small amounts of cholesterol. The liver makes all the cholesterol that the body needs. Extra (excess) cholesterol comes from the food that we eat. Cholesterol is carried from the liver by the blood through the blood vessels. If you have high cholesterol, deposits (plaques) may build up on the walls of your blood vessels (arteries). Plaques make the arteries narrower and stiffer. Cholesterol plaques increase  your risk for heart attack and stroke. Work with your health care provider to keep your cholesterol levels in a healthy range. What increases the risk? This condition is more likely to develop in people who:  Eat foods that are high in animal fat (saturated fat) or cholesterol.  Are overweight.  Are not getting enough exercise.  Have a family history of high cholesterol.  What are the signs or symptoms? There are no symptoms of this condition. How is this diagnosed? This condition may be diagnosed from the results of a blood test.  If you are older than age 61, your health care provider may check your cholesterol every 4-6 years.  You may be checked more often if you already have high cholesterol or other risk factors for heart disease.  The blood test for cholesterol measures:  "Bad" cholesterol (LDL cholesterol). This is the main type of cholesterol that causes heart disease. The desired level for LDL is less than 100.  "Good" cholesterol (HDL cholesterol). This type helps to protect against heart disease by cleaning the arteries and carrying the LDL away. The desired level for HDL is 60 or higher.  Triglycerides. These are fats that the body can store or burn for energy. The desired number for triglycerides is lower than 150.  Total cholesterol. This is a measure of the total amount of cholesterol in your blood, including LDL cholesterol, HDL cholesterol, and triglycerides. A healthy number is less than 200.  How is this treated? This condition is treated with diet changes, lifestyle changes, and medicines. Diet changes  This may include eating more whole grains, fruits, vegetables, nuts, and fish.  This may also include cutting back on red meat and foods that have a lot of added sugar. Lifestyle changes  Changes may include getting at least 40 minutes of aerobic exercise 3 times a week. Aerobic exercises include walking, biking, and swimming. Aerobic exercise  along with a  healthy diet can help you maintain a healthy weight.  Changes may also include quitting smoking. Medicines  Medicines are usually given if diet and lifestyle changes have failed to reduce your cholesterol to healthy levels.  Your health care provider may prescribe a statin medicine. Statin medicines have been shown to reduce cholesterol, which can reduce the risk of heart disease. Follow these instructions at home: Eating and drinking  If told by your health care provider:  Eat chicken (without skin), fish, veal, shellfish, ground [place] breast, and round or loin cuts of red meat.  Do not eat fried foods or fatty meats, such as hot dogs and salami.  Eat plenty of fruits, such as apples.  Eat plenty of vegetables, such as broccoli, potatoes, and carrots.  Eat beans, peas, and lentils.  Eat grains such as barley, rice, couscous, and bulgur wheat.  Eat pasta without cream sauces.  Use skim or nonfat milk, and eat low-fat or nonfat yogurt and cheeses.  Do not eat or drink whole milk, cream, ice cream, egg yolks, or hard cheeses.  Do not eat stick margarine or tub margarines that contain trans fats (also called partially hydrogenated oils).  Do not eat saturated tropical oils, such as coconut oil and palm oil.  Do not eat cakes, cookies, crackers, or other baked goods that contain trans fats.  General instructions  Exercise as directed by your health care provider. Increase your activity level with activities such as gardening, walking, and taking the stairs.  Take over-the-counter and prescription medicines only as told by your health care provider.  Do not use any products that contain nicotine or tobacco, such as cigarettes and e-cigarettes. If you need help quitting, ask your health care provider.  Keep all follow-up visits as told by your health care provider. This is important. Contact a health care provider if:  You are struggling to maintain a healthy diet or  weight.  You need help to start on an exercise program.  You need help to stop smoking. Get help right away if:  You have chest pain.  You have trouble breathing. This information is not intended to replace advice given to you by your health care provider. Make sure you discuss any questions you have with your health care provider. Document Released: 12/26/2004 Document Revised: 07/24/2015 Document Reviewed: 06/26/2015 Elsevier Interactive Patient Education  2018 [place].  Blood Pressure Record Sheet Your blood pressure on this visit to the emergency department or clinic is elevated. This does not necessarily mean you have high blood pressure (hypertension), but it does mean that your blood pressure needs to be rechecked. Many times your blood pressure can increase due to illness, pain, anxiety, or other factors. We recommend that you get a series of blood pressure readings done over a period of 5 days. It is best to get a reading in the morning and one in the evening. You should make sure to sit and relax for 1-5 minutes before the reading is taken. Write the readings down and make a follow-up appointment with your health care provider to discuss the results. If there is not a free clinic or a drug store with a blood-pressure-taking machine near you, you can purchase blood-pressure-taking equipment from a drug store. Having one in the home allows you the convenience of taking your blood pressure while you are home and relaxed. Blood Pressure Log Date: _______________________  a.m. _____________________  p.m. _____________________  Date: _______________________  a.m. _____________________  p.m. _____________________  Date: _______________________  a.m. _____________________  p.m. _____________________  Date: _______________________  a.m. _____________________  p.m. _____________________  Date: _______________________  a.m. _____________________  p.m.  _____________________  This information is not intended to replace advice given to you by your health care provider. Make sure you discuss any questions you have with your health care provider. Document Released: 09/24/2002 Document Revised: 12/10/2015 Document Reviewed: 02/18/2013 Elsevier Interactive Patient Education  2018 [employer].

## 2017-11-07 NOTE — Progress Notes (Addendum)
Subjective:     Patient ID: Micheal Holland, male   DOB: 09-22-1960, 57 y.o.   MRN: [id number]  HPI  Blood pressure 138/82, pulse 93, temperature 98.5 F (36.9 C), resp. rate 16, height 6' 2" (1.88 m), weight 229 lb (103.9 kg), SpO2 100 %.  Patient is a 57 year old male in no acute distress who comes to the clinic for annual physical, he reports he is doing well. 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   He reports he skips cholesterol pill every two or three days as he forgets to take. He reports he has been taking blood pressure at drug store and getting reading's 130/ over 70's to 80's. Denies any hypertensive readings. In office today is 132/82.  Walks a lot at work., denies any other significant exercise  He reports drinking two  - 40 oz day of beer a day.   Patient  denies any fever, body aches,chills, rash, chest pain, shortness of breath, nausea, vomiting, or diarrhea.   He reports he has had decreased ability to have an erection a second time after having intercourse initially  Without any difficulty- he states this is new for him  x 4 months. He reports he can get an erection a second time an hour later nut unable to maintain.  He reports first intercourse is normal and then  waits an hour and then unable to have intercourse a second time and maintain a second  erection. He has desire for sex.   He denies any abnormal discharge, urination issues, or pain. Denies any rectal pain or pressure. Denies any prolonged erection or pain with intercourse.Denies any hematuria.   Last eye exam : current he reports does not wear contacts or glasses.    Seen Dr. Fletcher Anon cardiology due to significant family history of heart problems and the following occurred 01/29/17 CT calcium score was obtained came back abnormal with a score of 54.  Patient was started on low-dose aspirin and rosuvastatin.  He reports no side effects. Was due for follow up 6 months after 1/19 - he missed this appointment.   04/03/16  colonoscopy next in 10 years, Denies any rectal bleeding.  Social History:  The patient  reports that  has never smoked. he has never used smokeless tobacco. He reports that he drinks about 2.4 oz of alcohol per week. He reports that he does not use drugs.   Family History:  The patient's family history includes Heart Problems in his sister; Heart attack in his brother, father, and mother; Heart disease in his brother, father, and mother; Hypertension in his father and mother; Kidney disease in his sister.    Current Outpatient Medications:  .  aspirin EC 81 MG tablet, Take 1 tablet (81 mg total) by mouth daily., Disp: 90 tablet, Rfl: 3 .  rosuvastatin (CRESTOR) 40 MG tablet, Take 1 tablet (40 mg total) by mouth daily., Disp: 90 tablet, Rfl: 0 .  VITAMIN D, ERGOCALCIFEROL, PO, Take by mouth 2 (two) times daily., Disp: , Rfl:   Review of Systems  Constitutional: Negative.  Negative for activity change, appetite change, chills, diaphoresis, fatigue, fever and unexpected weight change.  HENT: Negative for congestion, dental problem, drooling, ear discharge, ear pain, facial swelling, hearing loss, mouth sores, nosebleeds, postnasal drip, rhinorrhea, sinus pressure, sinus pain, sneezing, sore throat, tinnitus, trouble swallowing and voice change.   Eyes: Negative.   Respiratory: Negative.   Cardiovascular: Negative.   Gastrointestinal: Negative.   Endocrine: Negative.  Genitourinary: Positive for hematuria (none seen by patient- seen on POCT urine / trace leukocytes). Negative for decreased urine volume, difficulty urinating, discharge, dysuria, enuresis, flank pain, frequency, genital sores, penile pain, penile swelling, scrotal swelling, testicular pain and urgency.  Musculoskeletal: Negative.   Skin: Negative.   Allergic/Immunologic: Positive for food allergies. Negative for environmental allergies and immunocompromised state.        -- Other -- Other (See Comments)   --  Fresh fruit - makes  throat itch, canned does not   Neurological: Negative for dizziness, tremors, seizures, syncope, facial asymmetry, speech difficulty, weakness, light-headedness, numbness and headaches.  Hematological: Negative.   Psychiatric/Behavioral: Negative.        Objective:   Physical Exam  Constitutional: He is oriented to person, place, and time. He appears well-developed and well-nourished. No distress.  HENT:  Head: Normocephalic and atraumatic.  Right Ear: External ear normal.  Left Ear: External ear normal.  Nose: Nose normal.  Mouth/Throat: Oropharynx is clear and moist. No oropharyngeal exudate.  Eyes: Pupils are equal, round, and reactive to light. Conjunctivae and EOM are normal. Right eye exhibits no discharge. Left eye exhibits no discharge. No scleral icterus.  Neck: Normal range of motion. Neck supple. No JVD present. No tracheal deviation present. No thyromegaly present.  Cardiovascular: Normal rate, regular rhythm, normal heart sounds and intact distal pulses. Exam reveals no gallop and no friction rub.  No murmur heard. Pulmonary/Chest: Effort normal and breath sounds normal. No respiratory distress. He exhibits no tenderness.  Abdominal: Soft. Normal appearance and bowel sounds are normal. He exhibits no distension and no mass. There is no hepatosplenomegaly. There is no tenderness. There is no rebound, no guarding and no CVA tenderness. No hernia.  Genitourinary: Rectum normal, prostate normal, testes normal and penis normal. Rectal exam shows guaiac negative stool (unable to obtain stool ). Right testis shows no mass, no swelling and no tenderness. Right testis is descended. Cremasteric reflex is not absent on the right side. Left testis shows no mass, no swelling and no tenderness. Left testis is descended. Cremasteric reflex is not absent on the left side. No penile erythema or penile tenderness. No discharge found.  Genitourinary Comments: Tawni Millers RN in room for  penile and prostate exam.  No abnormalities palpated.    Musculoskeletal: Normal range of motion. He exhibits no edema, tenderness or deformity.  Lymphadenopathy:       Head (right side): No submental, no submandibular, no tonsillar, no preauricular, no posterior auricular and no occipital adenopathy present.       Head (left side): No submental, no submandibular, no tonsillar, no preauricular, no posterior auricular and no occipital adenopathy present.    He has no cervical adenopathy.  Neurological: He is alert and oriented to person, place, and time. He displays normal reflexes. No cranial nerve deficit or sensory deficit. He exhibits normal muscle tone. Coordination normal.  Skin: Skin is warm and dry. Capillary refill takes less than 2 seconds. He is not diaphoretic.  Psychiatric: He has a normal mood and affect. His behavior is normal. Judgment and thought content normal.  Vitals reviewed.      Assessment:     Routine adult health maintenance - Plan: EKG 12-Lead, POCT urinalysis dipstick  Elevated coronary artery calcium score  Prediabetes  Hyperlipidemia, unspecified hyperlipidemia type  Family history of death due to heart problem at 6 years of age or younger  Recent Results (from the past 2160 hour(s))  HgB A1c  Status: Abnormal   Collection Time: 10/18/17  7:40 AM  Result Value Ref Range   Hgb A1c MFr Bld 6.0 (H) 4.8 - 5.6 %    Comment:          Prediabetes: 5.7 - 6.4          Diabetes: >6.4          Glycemic control for adults with diabetes: <7.0    Est. average glucose Bld gHb Est-mCnc 126 mg/dL  CMP12+LP+TP+TSH+6AC+PSA+CBC.     Status: Abnormal   Collection Time: 10/18/17  7:40 AM  Result Value Ref Range   Glucose 98 65 - 99 mg/dL   Uric Acid 7.3 3.7 - 8.6 mg/dL    Comment:            Therapeutic target for gout patients: <6.0   BUN 13 6 - 24 mg/dL   Creatinine, Ser 1.18 0.76 - 1.27 mg/dL   GFR calc non Af Amer 69 >59 mL/min/1.73   GFR calc Af Amer 80  >59 mL/min/1.73   BUN/Creatinine Ratio 11 9 - 20   Sodium 143 134 - 144 mmol/L   Potassium 4.3 3.5 - 5.2 mmol/L   Chloride 104 96 - 106 mmol/L   Calcium 9.6 8.7 - 10.2 mg/dL   Phosphorus 4.1 2.5 - 4.5 mg/dL   Total Protein 7.0 6.0 - 8.5 g/dL   Albumin 4.9 3.5 - 5.5 g/dL   Globulin, Total 2.1 1.5 - 4.5 g/dL   Albumin/Globulin Ratio 2.3 (H) 1.2 - 2.2   Bilirubin Total 0.6 0.0 - 1.2 mg/dL   Alkaline Phosphatase 46 39 - 117 IU/L   LDH 164 121 - 224 IU/L   AST 28 0 - 40 IU/L   ALT 26 0 - 44 IU/L   GGT 72 (H) 0 - 65 IU/L   Iron 97 38 - 169 ug/dL   Cholesterol, Total 211 (H) 100 - 199 mg/dL   Triglycerides 136 0 - 149 mg/dL   HDL 61 >39 mg/dL   VLDL Cholesterol Cal 27 5 - 40 mg/dL   LDL Calculated 123 (H) 0 - 99 mg/dL   Chol/HDL Ratio 3.5 0.0 - 5.0 ratio    Comment:                                   T. Chol/HDL Ratio                                             Men  Women                               1/2 Avg.Risk  3.4    3.3                                   Avg.Risk  5.0    4.4                                2X Avg.Risk  9.6    7.1  3X Avg.Risk 23.4   11.0    Estimated CHD Risk 0.5 0.0 - 1.0 times avg.    Comment:                                   T. Chol/HDL Ratio                                             Men  Women                               1/2 Avg.Risk  3.4    3.3                                   Avg.Risk  5.0    4.4                                2X Avg.Risk  9.6    7.1                                3X Avg.Risk 23.4   11.0 The CHD Risk is based on the T. Chol/HDL ratio.  Other factors affect CHD Risk such as hypertension, smoking, diabetes, severe obesity, and family history of pre- mature CHD.    TSH 2.790 0.450 - 4.500 uIU/mL   T4, Total 6.1 4.5 - 12.0 ug/dL   T3 Uptake Ratio 29 24 - 39 %   Free Thyroxine Index 1.8 1.2 - 4.9   Prostate Specific Ag, Serum 0.3 0.0 - 4.0 ng/mL    Comment: Roche ECLIA methodology. According to the  American Urological Association, Serum PSA should decrease and remain at undetectable levels after radical prostatectomy. The AUA defines biochemical recurrence as an initial PSA value 0.2 ng/mL or greater followed by a subsequent confirmatory PSA value 0.2 ng/mL or greater. Values obtained with different assay methods or kits cannot be used interchangeably. Results cannot be interpreted as absolute evidence of the presence or absence of malignant disease.    WBC 5.1 3.4 - 10.8 x10E3/uL   RBC 4.80 4.14 - 5.80 x10E6/uL   Hemoglobin 14.4 13.0 - 17.7 g/dL   Hematocrit 43.3 37.5 - 51.0 %   MCV 90 79 - 97 fL   MCH 30.0 26.6 - 33.0 pg   MCHC 33.3 31.5 - 35.7 g/dL   RDW 13.7 12.3 - 15.4 %   Platelets 287 150 - 450 x10E3/uL   Neutrophils 53 Not Estab. %   Lymphs 32 Not Estab. %   Monocytes 9 Not Estab. %   Eos 5 Not Estab. %   Basos 1 Not Estab. %   Neutrophils Absolute 2.7 1.4 - 7.0 x10E3/uL   Lymphocytes Absolute 1.6 0.7 - 3.1 x10E3/uL   Monocytes Absolute 0.5 0.1 - 0.9 x10E3/uL   EOS (ABSOLUTE) 0.3 0.0 - 0.4 x10E3/uL   Basophils Absolute 0.1 0.0 - 0.2 x10E3/uL   Immature Granulocytes 0 Not Estab. %   Immature Grans (Abs) 0.0 0.0 - 0.1 x10E3/uL  POCT urinalysis dipstick     Status: Abnormal  Collection Time: 11/07/17  3:05 PM  Result Value Ref Range   Color, UA yellow    Clarity, UA clear    Glucose, UA Negative Negative   Bilirubin, UA trace    Ketones, UA none    Spec Grav, UA 1.015 1.010 - 1.025   Blood, UA trace    pH, UA 6.0 5.0 - 8.0   Protein, UA Negative Negative   Urobilinogen, UA 0.2 0.2 or 1.0 E.U./dL   Nitrite, UA negative    Leukocytes, UA Trace (A) Negative   Appearance normal    Odor none         Plan:    Offered nutritionist for prediabetes and  Hyperlipidemia- he declined. He reports he has met with her for the same diagnosis in past. Her is aware he can call the office for this should he desire as it is recommended by profider.  Lifestyle/ diet and  exercise recommended and reviewed.   Advised need to schedule follow up with cardiology per cardiologist instructions. He requested office number and given and is aware he is overdue. Advised to take hyperlipidemia medication daily as prescribed for best results..   Will refer to urology after discussing with Dr. Rosanna Randy for evaluation. Patient requested Cialis- discussed side effects and that would not prescribe given sexual history of able to have intercourse without difficulty x 1. He should be evaluated by urology given abnormal calcium score as well can not rule out coronary/ vascular changes and he should follow up with specialist and report any new symptoms to me.   Guaiac not performed- no stool available will return with stool sample for guaiac by appointment.  Record blood  Pressure log bring to office.  CC : copy of EKG to cardiology and Dr. Rosanna Randy. No significant change from previopus Orders Placed This Encounter  Procedures  . CULTURE, URINE COMPREHENSIVE  . Ambulatory referral to Urology    Referral Priority:   Urgent    Referral Type:   Consultation    Referral Reason:   Specialty Services Required    Referred to Provider:   Hollice Espy, MD    Requested Specialty:   Urology    Number of Visits Requested:   1  . POCT urinalysis dipstick  . EKG 12-Lead   Discussed labs with Dr. Rosanna Randy and will recheck lipids A1C and hepatic panel in 3-4 months. Patient will schedule.   Advised patient call the office or your primary care doctor for an appointment if no improvement within 72 hours or if any symptoms change or worsen at any time  Advised ER or urgent Care if after hours or on weekend. Call 911 for emergency symptoms at any time.Patinet verbalized understanding of all instructions given/reviewed and treatment plan and has no further questions or concerns at this time.    Patient verbalized understanding of all instructions given and denies any further questions at this  time.     Jerrol Banana., MD  Flinchum, Kelby Aline, FNP        Would limit or stop alcohol and repeat hepatic/A1C/lipids in 3-4 months.    GGT elevation  - discussed alcohol cessation.    Recheck labs in 3 to 4 months as below. Call if no call from Urology in 2 weeks or if any symptoms occur.   Orders Placed This Encounter  Procedures  . CULTURE, URINE COMPREHENSIVE  . Hepatic function panel    Standing Status:   Future    Standing Expiration  Date:   05/09/2018  . HgB A1c    Standing Status:   Future    Standing Expiration Date:   05/09/2018  . Lipid panel    Standing Status:   Future    Standing Expiration Date:   05/09/2018  . VITAMIN D 25 Hydroxy (Vit-D Deficiency, Fractures)    Standing Status:   Future    Standing Expiration Date:   05/09/2018  . Ambulatory referral to Urology    Referral Priority:   Urgent    Referral Type:   Consultation    Referral Reason:   Specialty Services Required    Referred to Provider:   Hollice Espy, MD    Requested Specialty:   Urology    Number of Visits Requested:   1  . POCT urinalysis dipstick  . EKG 12-Lead

## 2017-11-07 NOTE — Progress Notes (Signed)
Message  Received: 2 weeks ago  Message Contents  Jerrol Banana., MD  Doreen Beam, FNP  uld limit or stop alcohol and repeat hepatic/A1C/lipids in 3-4 months.

## 2017-11-10 LAB — CULTURE, URINE COMPREHENSIVE

## 2017-11-14 NOTE — Progress Notes (Signed)
Olivia Mackie  Will let Margarita know - he had no growth of bacteria in his urine and given hematuria he should keep his appointment with urology that is already scheduled.  Thanks, Laverna Peace MSN, AGNP-C, FNP-C

## 2017-11-15 ENCOUNTER — Telehealth: Payer: Self-pay | Admitting: Cardiovascular Disease

## 2017-11-15 NOTE — Progress Notes (Signed)
Attempted to schedule ov.  No ans no vm .  Not accepting calls at this time.  Will attempt to schedule another time.

## 2017-11-15 NOTE — Telephone Encounter (Signed)
Attempted to call and schedule No answer no voicemail

## 2017-11-15 NOTE — Telephone Encounter (Signed)
-----   Message from Clarisse Gouge sent at 11/15/2017  9:38 AM EST -----   ----- Message ----- From: Wellington Hampshire, MD Sent: 11/14/2017   4:48 PM EST To: Rebeca Alert Burl Scheduling  This patient is due for a routine follow up visit.    ----- Message ----- From: Doreen Beam, FNP Sent: 11/07/2017   4:08 PM EST To: Wellington Hampshire, MD

## 2017-11-16 ENCOUNTER — Telehealth: Payer: Self-pay

## 2017-11-16 NOTE — Telephone Encounter (Signed)
Left patient a message that his urine culture is negative.  He is to keep his appointment with Urology that is already scheduled.

## 2017-11-19 NOTE — Telephone Encounter (Signed)
Patient states he will call back when he can to schedule He has a lot going on

## 2017-11-20 ENCOUNTER — Encounter: Payer: Self-pay | Admitting: Medical

## 2017-11-20 ENCOUNTER — Ambulatory Visit: Payer: Self-pay | Admitting: Medical

## 2017-11-20 VITALS — BP 158/102 | HR 86 | Temp 97.6°F | Resp 16 | Wt 231.8 lb

## 2017-11-20 DIAGNOSIS — H6502 Acute serous otitis media, left ear: Secondary | ICD-10-CM

## 2017-11-20 DIAGNOSIS — J029 Acute pharyngitis, unspecified: Secondary | ICD-10-CM

## 2017-11-20 MED ORDER — AMOXICILLIN-POT CLAVULANATE 875-125 MG PO TABS: 1.0000 | ORAL_TABLET | Freq: Two times a day (BID) | ORAL | 0 refills | Status: DC

## 2017-11-20 NOTE — Patient Instructions (Signed)
Otitis Media, Adult Otitis media is redness, soreness, and puffiness (swelling) in the space just behind your eardrum (middle ear). It may be caused by allergies or infection. It often happens along with a cold. Follow these instructions at home:  Take your medicine as told. Finish it even if you start to feel better.  Only take over-the-counter or prescription medicines for pain, discomfort, or fever as told by your doctor.  Follow up with your doctor as told. Contact a doctor if:  You have otitis media only in one ear, or bleeding from your nose, or both.  You notice a lump on your neck.  You are not getting better in 3-5 days.  You feel worse instead of better. Get help right away if:  You have pain that is not helped with medicine.  You have puffiness, redness, or pain around your ear.  You get a stiff neck.  You cannot move part of your face (paralysis).  You notice that the bone behind your ear hurts when you touch it. This information is not intended to replace advice given to you by your health care provider. Make sure you discuss any questions you have with your health care provider. Document Released: 06/14/2007 Document Revised: 06/03/2015 Document Reviewed: 07/23/2012 Elsevier Interactive Patient Education  2017 [employer]  

## 2017-11-20 NOTE — Progress Notes (Signed)
   Subjective:    Patient ID: Micheal Holland, male    DOB: [birth year], 57 y.o.   MRN: [id number]  HPI   Blood pressure (!) 158/102, pulse 86, temperature 97.6 F (36.4 C), temperature source Tympanic, resp. rate 16, weight 231 lb 12.8 oz (105.1 kg), SpO2 100 %.   57 yo male in non acute distress. Start on Friday, both ears painful , sore throat, nasal congestion and runny clear, cough nonproductive. Seems the cough bothers him at night time. No fever or chills. Denies shortness of breath or chest pain.  Taking plain  Advil has helped last does last night. .  Review of Systems  Eyes: Positive for visual disturbance (2 weeks of see something black go across vision, just the right eye, new.).  Respiratory: Positive for cough. Negative for chest tightness, shortness of breath and wheezing.   Cardiovascular: Negative for chest pain.  Gastrointestinal: Negative for abdominal pain.  Endocrine: Negative for polydipsia, polyphagia and polyuria.  Genitourinary: Negative for dysuria.  Musculoskeletal: Negative for myalgias.  Skin: Negative for rash.  Allergic/Immunologic: Positive for food allergies (fresh fruit). Negative for environmental allergies.  Neurological: Negative for dizziness, syncope, light-headedness and headaches.  Hematological: Negative for adenopathy.  Psychiatric/Behavioral: Negative for behavioral problems, self-injury and suicidal ideas.  He feels he has had his eyes checked and is supposed to wear glasses and that is why that is why he has not followed up with his eye doctor.  He feels he just has to wear his glasses more.  This occurred last week but not since. No symptoms now.     Objective:   Physical Exam  Constitutional: He is oriented to person, place, and time. He appears well-developed and well-nourished.  HENT:  Head: Normocephalic and atraumatic.  Right Ear: Hearing, external ear and ear canal normal. Tympanic membrane is erythematous.  Left Ear: Hearing,  external ear and ear canal normal. A middle ear effusion is present.  Nose: Mucosal edema and rhinorrhea (green left) present.  Mouth/Throat: Uvula is midline and mucous membranes are normal. Posterior oropharyngeal erythema present. No oropharyngeal exudate or posterior oropharyngeal edema. Tonsils are 1+ on the right. Tonsils are 1+ on the left.  Eyes: Pupils are equal, round, and reactive to light. Conjunctivae and EOM are normal.  Neck: Normal range of motion. Neck supple.  Cardiovascular: Normal rate, regular rhythm and normal heart sounds.  Pulmonary/Chest: Effort normal and breath sounds normal.  Lymphadenopathy:    He has no cervical adenopathy.  Neurological: He is alert and oriented to person, place, and time.  Skin: Skin is warm and dry.  Psychiatric: He has a normal mood and affect. His behavior is normal. Judgment and thought content normal.  Nursing note and vitals reviewed.         Assessment & Plan:  Pharyngitis otitis media left side Elevated blood pressure, to return in  1 week for a arecheck. Reviewed s/s of high blood pressure and to return to the clinic if these occur.. Cerumen impaction is on the  Right side Declines  cough medication. Dilute salt water gargles TID and  OTC Motrin every  8 hours 400-600 mg as needed for pain. He currently is dong no decongestants. He is to return in 3-5 days if not improving. He verbalizes understanding and has no questions at discharge.

## 2017-11-28 NOTE — Progress Notes (Signed)
11/29/2017 10:55 AM   Micheal Holland Aug 05, 1960 [id number]  Referring provider: Doreen Beam, [place], [place] [address]  Chief Complaint  Patient presents with  . Recurrent UTI    New Patient    HPI: Micheal Holland is a 57 yo M who presents today for management and evaluation of erectile dysfunction and premature ejaculation.    The patient reports of a decreased ability to have an erection a second time after having intercourse initially that started 8 months ago. The time between intercourse is 30 min.   He is bothered by this.    Pt also reports of premature ejaculation that has also been going for about 8 months. Pt denies burning, pain or any leakage during urinating.   No penile curvature or pain.  No sexual trauma.  Desire is intact.    Pt is interested in sildenalfil and denies history of heart attacks.   UA shows leukocytes in the urine. No voiding symptoms.    He has been referred to cardiology with referral pending.  SHIM    Row Name 11/29/17 1032         SHIM: Over the last 6 months:   How do you rate your confidence that you could get and keep an erection?  Moderate     When you had erections with sexual stimulation, how often were your erections hard enough for penetration (entering your partner)?  Most Times (much more than half the time)     During sexual intercourse, how often were you able to maintain your erection after you had penetrated (entered) your partner?  Sometimes (about half the time)     During sexual intercourse, how difficult was it to maintain your erection to completion of intercourse?  Very Difficult     When you attempted sexual intercourse, how often was it satisfactory for you?  Sometimes (about half the time)       SHIM Total Score   SHIM  15        PMH: Past Medical History:  Diagnosis Date  . Allergy   . Hyperlipidemia     Surgical History: Past Surgical History:  Procedure Laterality  Date  . COLONOSCOPY WITH PROPOFOL N/A 04/03/2016   Procedure: COLONOSCOPY WITH PROPOFOL;  Surgeon: Lollie Sails, MD;  Location: [facility] ENDOSCOPY;  Service: Endoscopy;  Laterality: N/A;  . none      Home Medications:  Allergies as of 11/29/2017      Reactions   Other Other (See Comments)   Fresh fruit - makes throat itch, canned does not      Medication List        Accurate as of 11/29/17 10:55 AM. Always use your most recent med list.          amoxicillin-clavulanate 875-125 MG tablet Commonly known as:  AUGMENTIN Take 1 tablet by mouth 2 (two) times daily.   aspirin EC 81 MG tablet Take 1 tablet (81 mg total) by mouth daily.   ibuprofen 100 MG chewable tablet Commonly known as:  ADVIL,MOTRIN Chew by mouth every 8 (eight) hours as needed.   rosuvastatin 40 MG tablet Commonly known as:  CRESTOR Take 1 tablet (40 mg total) by mouth daily.   sildenafil 20 MG tablet Commonly known as:  REVATIO Take 1 tablet (20 mg total) by mouth as needed. Take 1-5 tabs as needed prior to intercourse   VITAMIN D (ERGOCALCIFEROL) PO Take by mouth 2 (two) times daily.  Allergies:  Allergies  Allergen Reactions  . Other Other (See Comments)    Fresh fruit - makes throat itch, canned does not    Family History: Family History  Problem Relation Age of Onset  . Hypertension Mother        heart disease died 50   . Heart disease Mother   . Heart attack Mother   . Heart disease Father        "hole  behind heart80 years old died from this "  . Hypertension Father   . Heart attack Father   . Kidney disease Sister        one sister has had transplant kidney / one sister died with tumor was J. witness did not do surgery  . Heart Problems Sister   . Heart disease Brother        heart disease "hole behind heart"  . Heart attack Brother     Social History:  reports that he has never smoked. He has never used smokeless tobacco. He reports that he drinks about 4.0 standard  drinks of alcohol per week. He reports that he does not use drugs.  ROS: UROLOGY Frequent Urination?: No Hard to postpone urination?: No Burning/pain with urination?: No Get up at night to urinate?: No Leakage of urine?: No Urine stream starts and stops?: No Trouble starting stream?: No Do you have to strain to urinate?: No Blood in urine?: No Urinary tract infection?: No Sexually transmitted disease?: No Injury to kidneys or bladder?: No Painful intercourse?: No Weak stream?: No Erection problems?: No Penile pain?: No  Gastrointestinal Nausea?: No Vomiting?: No Indigestion/heartburn?: No Diarrhea?: No Constipation?: No  Constitutional Fever: No Night sweats?: No Weight loss?: No Fatigue?: No  Skin Skin rash/lesions?: No Itching?: No  Eyes Blurred vision?: No Double vision?: No  Ears/Nose/Throat Sore throat?: No Sinus problems?: No  Hematologic/Lymphatic Swollen glands?: No Easy bruising?: No  Cardiovascular Leg swelling?: No Chest pain?: No  Respiratory Cough?: No Shortness of breath?: No  Endocrine Excessive thirst?: No  Musculoskeletal Back pain?: No Joint pain?: No  Neurological Headaches?: No Dizziness?: No  Psychologic Depression?: No Anxiety?: No  Physical Exam: BP (!) 152/97   Pulse 89   Ht 6\' 2"  (1.88 m)   Wt 230 lb (104.3 kg)   BMI 29.53 kg/m   Constitutional:  Alert and oriented, No acute distress. HEENT: [place] AT, moist mucus membranes.  Trachea midline, no masses. Cardiovascular: No clubbing, cyanosis, or edema. Respiratory: Normal respiratory effort, no increased work of breathing. Skin: No rashes, bruises or suspicious lesions. Neurologic: Grossly intact, no focal deficits, moving all 4 extremities. Psychiatric: Normal mood and affect.  Laboratory Data: Lab Results  Component Value Date   HGBA1C 6.0 (H) 10/18/2017    Urinalysis UA negative today, see epic  Pertinent Imaging: Results for orders placed or  performed in visit on 11/29/17  Bladder Scan (Post Void Residual) in office  Result Value Ref Range   Scan Result 1ml     Assessment & Plan:    1. Erectile Dysfunction Trial of sildenafil, 1-5 20 mg tablets for mild/ moderate ED Advised to take meds on an empty stomach and 1 hour before sex, increase the amount of pills by 1 if it sildenofil shows no effect The associated side-effects of sildenofil was discussed with the pt as the amount of pills increase. Encouraged to consult with cardiology  2.Premature ejaculation  Will treat ED first and reassess if this fails to improve Consider lidocaine jelly,  declined If fail to improve, consider alterative pharmacotherapy (prn SSRI)  Return if symptoms worsen or fail to improve.  [facility] [address], [place], [place] [address] [phone]  I, Lucas Mallow, am acting as a scribe for Dr. Hollice Espy,  I have reviewed the above documentation for accuracy and completeness, and I agree with the above.   Hollice Espy, MD

## 2017-11-29 ENCOUNTER — Ambulatory Visit: Payer: BLUE CROSS/BLUE SHIELD | Admitting: Urology

## 2017-11-29 ENCOUNTER — Encounter: Payer: Self-pay | Admitting: Urology

## 2017-11-29 VITALS — BP 152/97 | HR 89 | Ht 74.0 in | Wt 230.0 lb

## 2017-11-29 DIAGNOSIS — N5203 Combined arterial insufficiency and corporo-venous occlusive erectile dysfunction: Secondary | ICD-10-CM

## 2017-11-29 DIAGNOSIS — F524 Premature ejaculation: Secondary | ICD-10-CM | POA: Diagnosis not present

## 2017-11-29 DIAGNOSIS — N390 Urinary tract infection, site not specified: Secondary | ICD-10-CM | POA: Diagnosis not present

## 2017-11-29 LAB — URINALYSIS, COMPLETE
Bilirubin, UA: NEGATIVE
Glucose, UA: NEGATIVE
Ketones, UA: NEGATIVE
Leukocytes, UA: NEGATIVE
Nitrite, UA: NEGATIVE
Specific Gravity, UA: 1.025 (ref 1.005–1.030)
Urobilinogen, Ur: 0.2 mg/dL (ref 0.2–1.0)
pH, UA: 5.5 (ref 5.0–7.5)

## 2017-11-29 LAB — MICROSCOPIC EXAMINATION
Epithelial Cells (non renal): NONE SEEN /hpf (ref 0–10)
WBC, UA: NONE SEEN /hpf (ref 0–5)

## 2017-11-29 LAB — BLADDER SCAN AMB NON-IMAGING

## 2017-11-29 MED ORDER — SILDENAFIL CITRATE 20 MG PO TABS: 20.0000 mg | ORAL_TABLET | ORAL | 11 refills | Status: DC | PRN

## 2017-11-30 NOTE — Progress Notes (Signed)
11/30/17 attempted to contact patient again no answer.

## 2017-12-04 ENCOUNTER — Ambulatory Visit: Payer: Self-pay | Admitting: Medical

## 2017-12-04 ENCOUNTER — Ambulatory Visit: Payer: Self-pay | Admitting: Urology

## 2017-12-19 ENCOUNTER — Encounter: Payer: Self-pay | Admitting: Adult Health

## 2017-12-25 NOTE — Progress Notes (Signed)
Had Micheal Holland call to see if she could schedule an appointment.

## 2017-12-26 ENCOUNTER — Encounter: Payer: Self-pay | Admitting: Adult Health

## 2017-12-26 ENCOUNTER — Ambulatory Visit: Payer: Self-pay | Admitting: Adult Health

## 2017-12-26 VITALS — BP 163/107 | HR 88 | Temp 98.3°F | Resp 16 | Ht 74.0 in | Wt 233.0 lb

## 2017-12-26 DIAGNOSIS — I10 Essential (primary) hypertension: Secondary | ICD-10-CM

## 2017-12-26 DIAGNOSIS — E785 Hyperlipidemia, unspecified: Secondary | ICD-10-CM

## 2017-12-26 HISTORY — DX: Essential (primary) hypertension: I10

## 2017-12-26 MED ORDER — LOSARTAN POTASSIUM 50 MG PO TABS: 50.0000 mg | ORAL_TABLET | Freq: Every day | ORAL | 0 refills | Status: DC

## 2017-12-26 NOTE — Progress Notes (Addendum)
Subjective:     Patient ID: Micheal Holland, male   DOB: 1960-02-16, 57 y.o.   MRN: [id number]  HPI  Blood pressure (!) 163/107, pulse 88, temperature 98.3 F (36.8 C), resp. rate 16, height 6\' 2"  (1.88 m), weight 233 lb (105.7 kg), SpO2 100 %. Recheck by nursing Nelda Severe RN [phone].   Patient is a 57 year old male in no acute distress who comes to the clinic for recheck of blood pressure reading.   He denies any chest pain.  Denies edema.  He denies any headache.   Patient  denies any fever, body aches,chills, rash, chest pain, shortness of breath, nausea, vomiting, or diarrhea.   Continues to drink alcohol- despite education denies any increase or decrease.  Taking Cholesterol medicine daily.    Chart review history: 11/20/17  Blood pressure reading 158/102 pulse 86 was seen by colleague for pharyngitis.  11/29/17 was also seen by Hollice Espy MD - urology for hematuria and erectile issues B/P was 152/97 pulse 89 at that office visit. Assessment & Plan at that office visit was as below: urinalysis was negative he had been treated for Urinary Tract Infection 11/07/17. Bladder scan was also checked for post residual void at urology. 1. Erectile DysfunctionTrial of sildenafil, 1-5 20 mg tablets for mild/ moderate ED Advised to take meds on an empty stomach and 1 hour before sex, increase the amount of pills by 1 if it sildenofil shows no effecthe associated side-effects of sildenofil was discussed with the pt as the amount of pills increase.Encouraged to consult with cardiology2.Premature ejaculation Will treat ED first and reassess if this fails to improveConsider lidocaine jelly, declinedIf fail to improve, consider alterative pharmacotherapy (prn SSRI)Return if symptoms worsen or fail to improve.  .   Review of Systems  Constitutional: Negative.   HENT: Negative.   Respiratory: Negative.   Cardiovascular: Negative.   Gastrointestinal: Negative.   Genitourinary:  Negative.   Musculoskeletal: Negative.   Skin: Negative.   Neurological: Negative.   Hematological: Negative.   Psychiatric/Behavioral: Negative.        Objective:   Physical Exam Constitutional:      Appearance: Normal appearance.  HENT:     Head: Normocephalic and atraumatic.     Nose: Nose normal.  Eyes:     Extraocular Movements: Extraocular movements intact.     Conjunctiva/sclera: Conjunctivae normal.     Pupils: Pupils are equal, round, and reactive to light.  Neck:     Musculoskeletal: Normal range of motion and neck supple.  Cardiovascular:     Rate and Rhythm: Normal rate and regular rhythm.     Heart sounds: No murmur. No friction rub. No gallop.   Pulmonary:     Effort: Pulmonary effort is normal. No respiratory distress.     Breath sounds: Normal breath sounds. No stridor. No wheezing, rhonchi or rales.  Chest:     Chest wall: No tenderness.  Abdominal:     General: Abdomen is flat.     Palpations: Abdomen is soft.  Musculoskeletal: Normal range of motion.  Skin:    General: Skin is warm and dry.     Capillary Refill: Capillary refill takes less than 2 seconds.  Neurological:     General: No focal deficit present.     Mental Status: He is alert and oriented to person, place, and time.  Psychiatric:        Attention and Perception: Attention normal.        Mood and  Affect: Mood normal.        Speech: Speech normal.        Behavior: Behavior normal.        Thought Content: Thought content normal.        Cognition and Memory: Cognition normal.        Judgment: Judgment normal.        Assessment:    Hyperlipidemia, unspecified hyperlipidemia type - Plan: CMP12+LP+TP+TSH+6AC+PSA+CBC., HgB A1c  Hypertension, unspecified type  Labs 10/18/2017 BUN 6 - 24 mg/dL 13  15    12  13    Creatinine, Ser 0.76 - 1.27 mg/dL 1.18  1.05        Alkaline Phosphatase 39 - 117 IU/L 46  49    57  54   LDH 121 - 224 IU/L 164  162     192   AST 0 - 40 IU/L 28  26    28    32   ALT 0 - 44 IU/L 26  24    28  26          Plan:     Discussed hypertension with cardiologist Dr. Fletcher Anon. Will start as below   Meds ordered this encounter  Medications  . losartan (COZAAR) 50 MG tablet    Sig: Take 1 tablet (50 mg total) by mouth daily.    Dispense:  90 tablet    Refill:  0  Patient was reluctant to start  blood pressure medication as he felt it might worsen his sexual desire- provider educated and he is in agreement to start the medication as above after discussing risk factors of stroke and early cardiac death with untreated hypertension. He also has strong family cardiac history. He will follow back up with Dr. Fletcher Anon as cardiology instructed as well.     Discussed limiting or preferably discontinuing alcohol as well.  Labs due in February prefer after February 1st - that  First week  - recheck kidney function, lipids. A1C   Orders Placed This Encounter  Procedures  . CMP12+LP+TP+TSH+6AC+PSA+CBC.    Standing Status:   Future    Standing Expiration Date:   03/27/2018    Order Specific Question:   Has the patient fasted?    Answer:   Yes  . HgB A1c    Standing Status:   Future    Standing Expiration Date:   03/27/2018    Monitor Blood pressure this week and patient is unable to come back in next week he will return to clinic for blood pressure check on 01/07/18- Monday.  He will seek medical attention next week if any abnormal blood pressure.   Provider recommends not using erectile dysfunction drugs at this time and discussing with cardiologist.   Hanley Seamen and reviewed After Visit Summary(AVS) with patient.   Advised patient call the office or your primary care doctor for an appointment if no improvement within 72 hours or if any symptoms change or worsen at any time  Advised ER or urgent Care if after hours or on weekend. Call 911 for emergency symptoms at any time.Patinet verbalized understanding of all instructions given/reviewed and treatment plan and has no  further questions or concerns at this time.    Patient verbalized understanding of all instructions given and denies any further questions at this time.

## 2017-12-26 NOTE — Patient Instructions (Signed)
Losartan tablets  What is this medicine?  LOSARTAN (loe SAR tan) is used to treat high blood pressure and to reduce the risk of stroke in certain patients. This drug also slows the progression of kidney disease in patients with diabetes.  This medicine may be used for other purposes; ask your health care provider or pharmacist if you have questions.  COMMON BRAND NAME(S): Cozaar  What should I tell my health care provider before I take this medicine?  They need to know if you have any of these conditions:  -heart failure  -kidney or liver disease  -an unusual or allergic reaction to losartan, other medicines, foods, dyes, or preservatives  -pregnant or trying to get pregnant  -breast-feeding  How should I use this medicine?  Take this medicine by mouth with a glass of water. Follow the directions on the prescription label. This medicine can be taken with or without food. Take your doses at regular intervals. Do not take your medicine more often than directed.  Talk to your pediatrician regarding the use of this medicine in children. Special care may be needed.  Overdosage: If you think you have taken too much of this medicine contact a poison control center or emergency room at once.  NOTE: This medicine is only for you. Do not share this medicine with others.  What if I miss a dose?  If you miss a dose, take it as soon as you can. If it is almost time for your next dose, take only that dose. Do not take double or extra doses.  What may interact with this medicine?  -blood pressure medicines  -diuretics, especially triamterene, spironolactone, or amiloride  -fluconazole  -NSAIDs, medicines for pain and inflammation, like ibuprofen or naproxen  -potassium salts or potassium supplements  -rifampin  This list may not describe all possible interactions. Give your health care provider a list of all the medicines, herbs, non-prescription drugs, or dietary supplements you use. Also tell them if you smoke, drink alcohol, or  use illegal drugs. Some items may interact with your medicine.  What should I watch for while using this medicine?  Visit your doctor or health care professional for regular checks on your progress. Check your blood pressure as directed. Ask your doctor or health care professional what your blood pressure should be and when you should contact him or her. Call your doctor or health care professional if you notice an irregular or fast heart beat.  Women should inform their doctor if they wish to become pregnant or think they might be pregnant. There is a potential for serious side effects to an unborn child, particularly in the second or third trimester. Talk to your health care professional or pharmacist for more information.  You may get drowsy or dizzy. Do not drive, use machinery, or do anything that needs mental alertness until you know how this drug affects you. Do not stand or sit up quickly, especially if you are an older patient. This reduces the risk of dizzy or fainting spells. Alcohol can make you more drowsy and dizzy. Avoid alcoholic drinks.  Avoid salt substitutes unless you are told otherwise by your doctor or health care professional.  Do not treat yourself for coughs, colds, or pain while you are taking this medicine without asking your doctor or health care professional for advice. Some ingredients may increase your blood pressure.  What side effects may I notice from receiving this medicine?  Side effects that you should report to   irregular heart beat, palpitations, or chest pain -skin rash, itching -swelling of your face, lips, tongue, hands, or feet Side effects that usually do not require medical attention (report to your doctor or health care  professional if they continue or are bothersome): -cough -decreased sexual function or desire -headache -nasal congestion or stuffiness -nausea or stomach pain -sore or cramping muscles This list may not describe all possible side effects. Call your doctor for medical advice about side effects. You may report side effects to FDA at 1-800-FDA-1088. Where should I keep my medicine? Keep out of the reach of children. Store at room temperature between 15 and 30 degrees C (59 and 86 degrees F). Protect from light. Keep container tightly closed. Throw away any unused medicine after the expiration date. NOTE: This sheet is a summary. It may not cover all possible information. If you have questions about this medicine, talk to your doctor, pharmacist, or health care provider.  2019 Elsevier/Gold Standard (2007-03-08 16:42:18) DASH Eating Plan DASH stands for "Dietary Approaches to Stop Hypertension." The DASH eating plan is a healthy eating plan that has been shown to reduce high blood pressure (hypertension). It may also reduce your risk for type 2 diabetes, heart disease, and stroke. The DASH eating plan may also help with weight loss. What are tips for following this plan?  General guidelines  Avoid eating more than 2,300 mg (milligrams) of salt (sodium) a day. If you have hypertension, you may need to reduce your sodium intake to 1,500 mg a day.  Limit alcohol intake to no more than 1 drink a day for nonpregnant women and 2 drinks a day for men. One drink equals 12 oz of beer, 5 oz of wine, or 1 oz of hard liquor.  Work with your health care provider to maintain a healthy body weight or to lose weight. Ask what an ideal weight is for you.  Get at least 30 minutes of exercise that causes your heart to beat faster (aerobic exercise) most days of the week. Activities may include walking, swimming, or biking.  Work with your health care provider or diet and nutrition specialist (dietitian) to  adjust your eating plan to your individual calorie needs. Reading food labels   Check food labels for the amount of sodium per serving. Choose foods with less than 5 percent of the Daily Value of sodium. Generally, foods with less than 300 mg of sodium per serving fit into this eating plan.  To find whole grains, look for the word "whole" as the first word in the ingredient list. Shopping  Buy products labeled as "low-sodium" or "no salt added."  Buy fresh foods. Avoid canned foods and premade or frozen meals. Cooking  Avoid adding salt when cooking. Use salt-free seasonings or herbs instead of table salt or sea salt. Check with your health care provider or pharmacist before using salt substitutes.  Do not fry foods. Cook foods using healthy methods such as baking, boiling, grilling, and broiling instead.  Cook with heart-healthy oils, such as olive, canola, soybean, or sunflower oil. Meal planning  Eat a balanced diet that includes: ? 5 or more servings of fruits and vegetables each day. At each meal, try to fill half of your plate with fruits and vegetables. ? Up to 6-8 servings of whole grains each day. ? Less than 6 oz of lean meat, poultry, or fish each day. A 3-oz serving of meat is about the same size as a deck of cards. One egg  equals 1 oz. ? 2 servings of low-fat dairy each day. ? A serving of nuts, seeds, or beans 5 times each week. ? Heart-healthy fats. Healthy fats called Omega-3 fatty acids are found in foods such as flaxseeds and coldwater fish, like sardines, salmon, and mackerel.  Limit how much you eat of the following: ? Canned or prepackaged foods. ? Food that is high in trans fat, such as fried foods. ? Food that is high in saturated fat, such as fatty meat. ? Sweets, desserts, sugary drinks, and other foods with added sugar. ? Full-fat dairy products.  Do not salt foods before eating.  Try to eat at least 2 vegetarian meals each week.  Eat more home-cooked  food and less restaurant, buffet, and fast food.  When eating at a restaurant, ask that your food be prepared with less salt or no salt, if possible. What foods are recommended? The items listed may not be a complete list. Talk with your dietitian about what dietary choices are best for you. Grains Whole-grain or whole-wheat bread. Whole-grain or whole-wheat pasta. Brown rice. [place]. Bulgur. Whole-grain and low-sodium cereals. Pita bread. Low-fat, low-sodium crackers. Whole-wheat flour tortillas. Vegetables Fresh or frozen vegetables (raw, steamed, roasted, or grilled). Low-sodium or reduced-sodium tomato and vegetable juice. Low-sodium or reduced-sodium tomato sauce and tomato paste. Low-sodium or reduced-sodium canned vegetables. Fruits All fresh, dried, or frozen fruit. Canned fruit in natural juice (without added sugar). Meat and other protein foods Skinless chicken or [place]. Ground chicken or [place]. Pork with fat trimmed off. Fish and seafood. Egg whites. Dried beans, peas, or lentils. Unsalted nuts, nut butters, and seeds. Unsalted canned beans. Lean cuts of beef with fat trimmed off. Low-sodium, lean deli meat. Dairy Low-fat (1%) or fat-free (skim) milk. Fat-free, low-fat, or reduced-fat cheeses. Nonfat, low-sodium ricotta or cottage cheese. Low-fat or nonfat yogurt. Low-fat, low-sodium cheese. Fats and oils Soft margarine without trans fats. Vegetable oil. Low-fat, reduced-fat, or light mayonnaise and salad dressings (reduced-sodium). Canola, safflower, olive, soybean, and sunflower oils. Avocado. Seasoning and other foods Herbs. Spices. Seasoning mixes without salt. Unsalted popcorn and pretzels. Fat-free sweets. What foods are not recommended? The items listed may not be a complete list. Talk with your dietitian about what dietary choices are best for you. Grains Baked goods made with fat, such as croissants, muffins, or some breads. Dry pasta or rice meal  packs. Vegetables Creamed or fried vegetables. Vegetables in a cheese sauce. Regular canned vegetables (not low-sodium or reduced-sodium). Regular canned tomato sauce and paste (not low-sodium or reduced-sodium). Regular tomato and vegetable juice (not low-sodium or reduced-sodium). Angie Fava. Olives. Fruits Canned fruit in a light or heavy syrup. Fried fruit. Fruit in cream or butter sauce. Meat and other protein foods Fatty cuts of meat. Ribs. Fried meat. Berniece Salines. Sausage. Bologna and other processed lunch meats. Salami. Fatback. Hotdogs. Bratwurst. Salted nuts and seeds. Canned beans with added salt. Canned or smoked fish. Whole eggs or egg yolks. Chicken or [place] with skin. Dairy Whole or 2% milk, cream, and half-and-half. Whole or full-fat cream cheese. Whole-fat or sweetened yogurt. Full-fat cheese. Nondairy creamers. Whipped toppings. Processed cheese and cheese spreads. Fats and oils Butter. Stick margarine. Lard. Shortening. Ghee. Bacon fat. Tropical oils, such as coconut, palm kernel, or palm oil. Seasoning and other foods Salted popcorn and pretzels. Onion salt, garlic salt, seasoned salt, table salt, and sea salt. Worcestershire sauce. Tartar sauce. Barbecue sauce. Teriyaki sauce. Soy sauce, including reduced-sodium. Steak sauce. Canned and packaged gravies. Fish sauce.  Oyster sauce. Cocktail sauce. Horseradish that you find on the shelf. Ketchup. Mustard. Meat flavorings and tenderizers. Bouillon cubes. Hot sauce and Tabasco sauce. Premade or packaged marinades. Premade or packaged taco seasonings. Relishes. Regular salad dressings. Where to find more information:  National Heart, Lung, and [place]: https://wilson-eaton.com/  American Heart Association: www.heart.org Summary  The DASH eating plan is a healthy eating plan that has been shown to reduce high blood pressure (hypertension). It may also reduce your risk for type 2 diabetes, heart disease, and stroke.  With the DASH eating  plan, you should limit salt (sodium) intake to 2,300 mg a day. If you have hypertension, you may need to reduce your sodium intake to 1,500 mg a day.  When on the DASH eating plan, aim to eat more fresh fruits and vegetables, whole grains, lean proteins, low-fat dairy, and heart-healthy fats.  Work with your health care provider or diet and nutrition specialist (dietitian) to adjust your eating plan to your individual calorie needs. This information is not intended to replace advice given to you by your health care provider. Make sure you discuss any questions you have with your health care provider. Document Released: 12/15/2010 Document Revised: 12/20/2015 Document Reviewed: 12/20/2015 Elsevier Interactive Patient Education  2019 [employer]. Hypertension Hypertension, commonly called high blood pressure, is when the force of blood pumping through the arteries is too strong. The arteries are the blood vessels that carry blood from the heart throughout the body. Hypertension forces the heart to work harder to pump blood and may cause arteries to become narrow or stiff. Having untreated or uncontrolled hypertension can cause heart attacks, strokes, kidney disease, and other problems. A blood pressure reading consists of a higher number over a lower number. Ideally, your blood pressure should be below 120/80. The first ("top") number is called the systolic pressure. It is a measure of the pressure in your arteries as your heart beats. The second ("bottom") number is called the diastolic pressure. It is a measure of the pressure in your arteries as the heart relaxes. What are the causes? The cause of this condition is not known. What increases the risk? Some risk factors for high blood pressure are under your control. Others are not. Factors you can change  Smoking.  Having type 2 diabetes mellitus, high cholesterol, or both.  Not getting enough exercise or physical activity.  Being  overweight.  Having too much fat, sugar, calories, or salt (sodium) in your diet.  Drinking too much alcohol. Factors that are difficult or impossible to change  Having chronic kidney disease.  Having a family history of high blood pressure.  Age. Risk increases with age.  Race. You may be at higher risk if you are African-American.  Gender. Men are at higher risk than women before age 31. After age 84, women are at higher risk than men.  Having obstructive sleep apnea.  Stress. What are the signs or symptoms? Extremely high blood pressure (hypertensive crisis) may cause:  Headache.  Anxiety.  Shortness of breath.  Nosebleed.  Nausea and vomiting.  Severe chest pain.  Jerky movements you cannot control (seizures). How is this diagnosed? This condition is diagnosed by measuring your blood pressure while you are seated, with your arm resting on a surface. The cuff of the blood pressure monitor will be placed directly against the skin of your upper arm at the level of your heart. It should be measured at least twice using the same arm. Certain conditions can  cause a difference in blood pressure between your right and left arms. Certain factors can cause blood pressure readings to be lower or higher than normal (elevated) for a short period of time:  When your blood pressure is higher when you are in a health care provider's office than when you are at home, this is called white coat hypertension. Most people with this condition do not need medicines.  When your blood pressure is higher at home than when you are in a health care provider's office, this is called masked hypertension. Most people with this condition may need medicines to control blood pressure. If you have a high blood pressure reading during one visit or you have normal blood pressure with other risk factors:  You may be asked to return on a different day to have your blood pressure checked again.  You may  be asked to monitor your blood pressure at home for 1 week or longer. If you are diagnosed with hypertension, you may have other blood or imaging tests to help your health care provider understand your overall risk for other conditions. How is this treated? This condition is treated by making healthy lifestyle changes, such as eating healthy foods, exercising more, and reducing your alcohol intake. Your health care provider may prescribe medicine if lifestyle changes are not enough to get your blood pressure under control, and if:  Your systolic blood pressure is above 130.  Your diastolic blood pressure is above 80. Your personal target blood pressure may vary depending on your medical conditions, your age, and other factors. Follow these instructions at home: Eating and drinking   Eat a diet that is high in fiber and potassium, and low in sodium, added sugar, and fat. An example eating plan is called the DASH (Dietary Approaches to Stop Hypertension) diet. To eat this way: ? Eat plenty of fresh fruits and vegetables. Try to fill half of your plate at each meal with fruits and vegetables. ? Eat whole grains, such as whole wheat pasta, brown rice, or whole grain bread. Fill about one quarter of your plate with whole grains. ? Eat or drink low-fat dairy products, such as skim milk or low-fat yogurt. ? Avoid fatty cuts of meat, processed or cured meats, and poultry with skin. Fill about one quarter of your plate with lean proteins, such as fish, chicken without skin, beans, eggs, and tofu. ? Avoid premade and processed foods. These tend to be higher in sodium, added sugar, and fat.  Reduce your daily sodium intake. Most people with hypertension should eat less than 1,500 mg of sodium a day.  Limit alcohol intake to no more than 1 drink a day for nonpregnant women and 2 drinks a day for men. One drink equals 12 oz of beer, 5 oz of wine, or 1 oz of hard liquor. Lifestyle   Work with your  health care provider to maintain a healthy body weight or to lose weight. Ask what an ideal weight is for you.  Get at least 30 minutes of exercise that causes your heart to beat faster (aerobic exercise) most days of the week. Activities may include walking, swimming, or biking.  Include exercise to strengthen your muscles (resistance exercise), such as pilates or lifting weights, as part of your weekly exercise routine. Try to do these types of exercises for 30 minutes at least 3 days a week.  Do not use any products that contain nicotine or tobacco, such as cigarettes and e-cigarettes. If you  need help quitting, ask your health care provider.  Monitor your blood pressure at home as told by your health care provider.  Keep all follow-up visits as told by your health care provider. This is important. Medicines  Take over-the-counter and prescription medicines only as told by your health care provider. Follow directions carefully. Blood pressure medicines must be taken as prescribed.  Do not skip doses of blood pressure medicine. Doing this puts you at risk for problems and can make the medicine less effective.  Ask your health care provider about side effects or reactions to medicines that you should watch for. Contact a health care provider if:  You think you are having a reaction to a medicine you are taking.  You have headaches that keep coming back (recurring).  You feel dizzy.  You have swelling in your ankles.  You have trouble with your vision. Get help right away if:  You develop a severe headache or confusion.  You have unusual weakness or numbness.  You feel faint.  You have severe pain in your chest or abdomen.  You vomit repeatedly.  You have trouble breathing. Summary  Hypertension is when the force of blood pumping through your arteries is too strong. If this condition is not controlled, it may put you at risk for serious complications.  Your personal  target blood pressure may vary depending on your medical conditions, your age, and other factors. For most people, a normal blood pressure is less than 120/80.  Hypertension is treated with lifestyle changes, medicines, or a combination of both. Lifestyle changes include weight loss, eating a healthy, low-sodium diet, exercising more, and limiting alcohol. This information is not intended to replace advice given to you by your health care provider. Make sure you discuss any questions you have with your health care provider. Document Released: 12/26/2004 Document Revised: 11/24/2015 Document Reviewed: 11/24/2015 Elsevier Interactive Patient Education  2019 [employer].  Provider does not prefer you using Sildenafil tablets (Erectile Dysfunction)- here is list of side effects. Discuss with urology and cardiology  What is this medicine? SILDENAFIL (sil DEN a fil) is used to treat erection problems in men. This medicine may be used for other purposes; ask your health care provider or pharmacist if you have questions. COMMON BRAND NAME(S): Viagra What should I tell my health care provider before I take this medicine? They need to know if you have any of these conditions: -bleeding disorders -eye or vision problems, including a rare inherited eye disease called retinitis pigmentosa -anatomical deformation of the penis, Peyronie's disease, or history of priapism (painful and prolonged erection) -heart disease, angina, a history of heart attack, irregular heart beats, or other heart problems -high or low blood pressure -history of blood diseases, like sickle cell anemia or leukemia -history of stomach bleeding -kidney disease -liver disease -stroke -an unusual or allergic reaction to sildenafil, other medicines, foods, dyes, or preservatives -pregnant or trying to get pregnant -breast-feeding How should I use this medicine? Take this medicine by mouth with a glass of water. Follow the  directions on the prescription label. The dose is usually taken 1 hour before sexual activity. You should not take the dose more than once per day. Do not take your medicine more often than directed. Talk to your pediatrician regarding the use of this medicine in children. This medicine is not used in children for this condition. Overdosage: If you think you have taken too much of this medicine contact a poison control center or  emergency room at once. NOTE: This medicine is only for you. Do not share this medicine with others. What if I miss a dose? This does not apply. Do not take double or extra doses. What may interact with this medicine? Do not take this medicine with any of the following medications: -cisapride -nitrates like amyl nitrite, isosorbide dinitrate, isosorbide mononitrate, nitroglycerin -riociguat This medicine may also interact with the following medications: -antiviral medicines for HIV or AIDS -bosentan -certain medicines for benign prostatic hyperplasia (BPH) -certain medicines for blood pressure -certain medicines for fungal infections like ketoconazole and itraconazole -cimetidine -erythromycin -rifampin This list may not describe all possible interactions. Give your health care provider a list of all the medicines, herbs, non-prescription drugs, or dietary supplements you use. Also tell them if you smoke, drink alcohol, or use illegal drugs. Some items may interact with your medicine. What should I watch for while using this medicine? If you notice any changes in your vision while taking this drug, call your doctor or health care professional as soon as possible. Stop using this medicine and call your health care provider right away if you have a loss of sight in one or both eyes. Contact your doctor or health care professional right away if you have an erection that lasts longer than 4 hours or if it becomes painful. This may be a sign of a serious problem and must be  treated right away to prevent permanent damage. If you experience symptoms of nausea, dizziness, chest pain or arm pain upon initiation of sexual activity after taking this medicine, you should refrain from further activity and call your doctor or health care professional as soon as possible. Do not drink alcohol to excess (examples, 5 glasses of wine or 5 shots of whiskey) when taking this medicine. When taken in excess, alcohol can increase your chances of getting a headache or getting dizzy, increasing your heart rate or lowering your blood pressure. Using this medicine does not protect you or your partner against HIV infection (the virus that causes AIDS) or other sexually transmitted diseases. What side effects may I notice from receiving this medicine? Side effects that you should report to your doctor or health care professional as soon as possible: -allergic reactions like skin rash, itching or hives, swelling of the face, lips, or tongue -breathing problems -changes in hearing -changes in vision -chest pain -fast, irregular heartbeat -prolonged or painful erection -seizures Side effects that usually do not require medical attention (report to your doctor or health care professional if they continue or are bothersome): -back pain -dizziness -flushing -headache -indigestion -muscle aches -nausea -stuffy or runny nose This list may not describe all possible side effects. Call your doctor for medical advice about side effects. You may report side effects to FDA at 1-800-FDA-1088. Where should I keep my medicine? Keep out of reach of children. Store at room temperature between 15 and 30 degrees C (59 and 86 degrees F). Throw away any unused medicine after the expiration date. NOTE: This sheet is a summary. It may not cover all possible information. If you have questions about this medicine, talk to your doctor, pharmacist, or health care provider.  2019 Elsevier/Gold Standard  (2014-12-09 12:00:25)

## 2018-01-07 ENCOUNTER — Encounter: Payer: Self-pay | Admitting: Adult Health

## 2018-01-07 ENCOUNTER — Ambulatory Visit: Payer: Self-pay | Admitting: Adult Health

## 2018-01-07 VITALS — BP 148/92 | HR 92 | Wt 234.0 lb

## 2018-01-07 DIAGNOSIS — I10 Essential (primary) hypertension: Secondary | ICD-10-CM

## 2018-01-07 DIAGNOSIS — R931 Abnormal findings on diagnostic imaging of heart and coronary circulation: Secondary | ICD-10-CM

## 2018-01-07 NOTE — Progress Notes (Addendum)
Subjective:     Patient ID: Micheal Holland, male   DOB: [birth year], 57 y.o.   MRN: [id number]  HPI   Blood pressure (!) 148/92, pulse 92, weight 234 lb (106.1 kg), SpO2 100 %. Patient is a 57 year old male in no acute distress who comes to the clinic for blood pressure check after starting Losartan 50 mg daily on 12/27/2017.  He reports he " feels fine " since starting medication and that he did not feel bad before. He reports he has taken Losartan when he remembers to take it and could have missed a few days here and there since starting.   Allergies  Allergen Reactions  . Other Other (See Comments)    Fresh fruit - makes throat itch, canned does not    Patient  denies any fever, body aches,chills, rash, chest pain, shortness of breath, nausea, vomiting, or diarrhea.  He denies edema. Denies any myalgias.  He denies any throat swelling, tongue or lip swelling and denies any changes in his voice.  Denies palpitations.  He is due for fasting labs 2/1 and is aware.   Declined Flu shot.  Will check on Tetanus last immunization in Epic shows 1983.    Review of Systems  Constitutional: Negative.   HENT: Negative.   Respiratory: Negative.   Cardiovascular: Negative.   Gastrointestinal: Negative.   Genitourinary: Negative.   Musculoskeletal: Negative.   Skin: Negative.   Neurological: Negative.   Hematological: Negative.   Psychiatric/Behavioral: Negative.        Objective:   Physical Exam Vitals signs and nursing note reviewed.  Constitutional:      Appearance: Normal appearance.  HENT:     Head: Normocephalic and atraumatic.     Nose: Nose normal.     Mouth/Throat:     Mouth: Mucous membranes are moist.     Pharynx: No oropharyngeal exudate or posterior oropharyngeal erythema.  Eyes:     Extraocular Movements: Extraocular movements intact.     Pupils: Pupils are equal, round, and reactive to light.  Neck:     Musculoskeletal: Full passive range of motion without  pain, normal range of motion and neck supple.     Vascular: Normal carotid pulses. No carotid bruit or JVD.     Trachea: Trachea and phonation normal.  Cardiovascular:     Rate and Rhythm: Normal rate and regular rhythm.     Pulses: Normal pulses.     Heart sounds: Normal heart sounds. Heart sounds not distant. No murmur. No friction rub. No gallop.   Pulmonary:     Effort: Pulmonary effort is normal. No respiratory distress.     Breath sounds: Normal breath sounds. No stridor. No wheezing, rhonchi or rales.  Chest:     Chest wall: No tenderness.  Abdominal:     General: Abdomen is flat.  Musculoskeletal: Normal range of motion.     Right lower leg: No edema.     Left lower leg: No edema.  Lymphadenopathy:     Cervical: No cervical adenopathy.  Skin:    General: Skin is warm and dry.     Capillary Refill: Capillary refill takes less than 2 seconds.  Neurological:     Mental Status: He is alert and oriented to person, place, and time.  Psychiatric:        Mood and Affect: Mood normal.        Thought Content: Thought content normal.        Judgment: Judgment normal.  Assessment:    Hypertension, unspecified type  Elevated coronary artery calcium score      Plan:    Follow up if he has had Tetanus immunization at next visit and can give at that visit if needed to be up to date.   He denies any need for refills.   Continue same dose of Losartan as below.   Current Outpatient Medications:  .  aspirin EC 81 MG tablet, Take 1 tablet (81 mg total) by mouth daily., Disp: 90 tablet, Rfl: 3 .  ibuprofen (ADVIL,MOTRIN) 100 MG chewable tablet, Chew by mouth every 8 (eight) hours as needed., Disp: , Rfl:  .  losartan (COZAAR) 50 MG tablet, Take 1 tablet (50 mg total) by mouth daily., Disp: 90 tablet, Rfl: 0 .  rosuvastatin (CRESTOR) 40 MG tablet, Take 1 tablet (40 mg total) by mouth daily., Disp: 90 tablet, Rfl: 0 .  sildenafil (REVATIO) 20 MG tablet, Take 1 tablet (20 mg  total) by mouth as needed. Take 1-5 tabs as needed prior to intercourse, Disp: 30 tablet, Rfl: 11 .  VITAMIN D, ERGOCALCIFEROL, PO, Take by mouth 2 (two) times daily., Disp: , Rfl:  Reports not using Sildenafil ( discussed at last visit safety risks )  Recheck blood pressure in 2 weeks follow up.  Labs due Fasting as near and after 02/09/2018.   Provider thoroughly discussed in collaboration above plan with supervising physician Dr. Miguel Aschoff who is in agreement with the care plan as above.   Advised patient call the office or your primary care doctor for an appointment if no improvement within 72 hours or if any symptoms change or worsen at any time  Advised ER or urgent Care if after hours or on weekend. Call 911 for emergency symptoms at any time.Patinet verbalized understanding of all instructions given/reviewed and treatment plan and has no further questions or concerns at this time.    Patient verbalized understanding of all instructions given and denies any further questions at this time.

## 2018-01-07 NOTE — Patient Instructions (Signed)
Managing Your Hypertension Hypertension is commonly called high blood pressure. This is when the force of your blood pressing against the walls of your arteries is too strong. Arteries are blood vessels that carry blood from your heart throughout your body. Hypertension forces the heart to work harder to pump blood, and may cause the arteries to become narrow or stiff. Having untreated or uncontrolled hypertension can cause heart attack, stroke, kidney disease, and other problems. What are blood pressure readings? A blood pressure reading consists of a higher number over a lower number. Ideally, your blood pressure should be below 120/80. The first ("top") number is called the systolic pressure. It is a measure of the pressure in your arteries as your heart beats. The second ("bottom") number is called the diastolic pressure. It is a measure of the pressure in your arteries as the heart relaxes. What does my blood pressure reading mean? Blood pressure is classified into four stages. Based on your blood pressure reading, your health care provider may use the following stages to determine what type of treatment you need, if any. Systolic pressure and diastolic pressure are measured in a unit called mm Hg. Normal  Systolic pressure: below 120.  Diastolic pressure: below 80. Elevated  Systolic pressure: 120-129.  Diastolic pressure: below 80. Hypertension stage 1  Systolic pressure: 130-139.  Diastolic pressure: 80-89. Hypertension stage 2  Systolic pressure: 140 or above.  Diastolic pressure: 90 or above. What health risks are associated with hypertension? Managing your hypertension is an important responsibility. Uncontrolled hypertension can lead to:  A heart attack.  A stroke.  A weakened blood vessel (aneurysm).  Heart failure.  Kidney damage.  Eye damage.  Metabolic syndrome.  Memory and concentration problems. What changes can I make to manage my  hypertension? Hypertension can be managed by making lifestyle changes and possibly by taking medicines. Your health care provider will help you make a plan to bring your blood pressure within a normal range. Eating and drinking   Eat a diet that is high in fiber and potassium, and low in salt (sodium), added sugar, and fat. An example eating plan is called the DASH (Dietary Approaches to Stop Hypertension) diet. To eat this way: ? Eat plenty of fresh fruits and vegetables. Try to fill half of your plate at each meal with fruits and vegetables. ? Eat whole grains, such as whole wheat pasta, brown rice, or whole grain bread. Fill about one quarter of your plate with whole grains. ? Eat low-fat diary products. ? Avoid fatty cuts of meat, processed or cured meats, and poultry with skin. Fill about one quarter of your plate with lean proteins such as fish, chicken without skin, beans, eggs, and tofu. ? Avoid premade and processed foods. These tend to be higher in sodium, added sugar, and fat.  Reduce your daily sodium intake. Most people with hypertension should eat less than 1,500 mg of sodium a day.  Limit alcohol intake to no more than 1 drink a day for nonpregnant women and 2 drinks a day for men. One drink equals 12 oz of beer, 5 oz of wine, or 1 oz of hard liquor. Lifestyle  Work with your health care provider to maintain a healthy body weight, or to lose weight. Ask what an ideal weight is for you.  Get at least 30 minutes of exercise that causes your heart to beat faster (aerobic exercise) most days of the week. Activities may include walking, swimming, or biking.  Include exercise   to strengthen your muscles (resistance exercise), such as weight lifting, as part of your weekly exercise routine. Try to do these types of exercises for 30 minutes at least 3 days a week.  Do not use any products that contain nicotine or tobacco, such as cigarettes and e-cigarettes. If you need help quitting,  ask your health care provider.  Control any long-term (chronic) conditions you have, such as high cholesterol or diabetes. Monitoring  Monitor your blood pressure at home as told by your health care provider. Your personal target blood pressure may vary depending on your medical conditions, your age, and other factors.  Have your blood pressure checked regularly, as often as told by your health care provider. Working with your health care provider  Review all the medicines you take with your health care provider because there may be side effects or interactions.  Talk with your health care provider about your diet, exercise habits, and other lifestyle factors that may be contributing to hypertension.  Visit your health care provider regularly. Your health care provider can help you create and adjust your plan for managing hypertension. Will I need medicine to control my blood pressure? Your health care provider may prescribe medicine if lifestyle changes are not enough to get your blood pressure under control, and if:  Your systolic blood pressure is 130 or higher.  Your diastolic blood pressure is 80 or higher. Take medicines only as told by your health care provider. Follow the directions carefully. Blood pressure medicines must be taken as prescribed. The medicine does not work as well when you skip doses. Skipping doses also puts you at risk for problems. Contact a health care provider if:  You think you are having a reaction to medicines you have taken.  You have repeated (recurrent) headaches.  You feel dizzy.  You have swelling in your ankles.  You have trouble with your vision. Get help right away if:  You develop a severe headache or confusion.  You have unusual weakness or numbness, or you feel faint.  You have severe pain in your chest or abdomen.  You vomit repeatedly.  You have trouble breathing. Summary  Hypertension is when the force of blood pumping  through your arteries is too strong. If this condition is not controlled, it may put you at risk for serious complications.  Your personal target blood pressure may vary depending on your medical conditions, your age, and other factors. For most people, a normal blood pressure is less than 120/80.  Hypertension is managed by lifestyle changes, medicines, or both. Lifestyle changes include weight loss, eating a healthy, low-sodium diet, exercising more, and limiting alcohol. This information is not intended to replace advice given to you by your health care provider. Make sure you discuss any questions you have with your health care provider. Document Released: 09/20/2011 Document Revised: 11/24/2015 Document Reviewed: 11/24/2015 Elsevier Interactive Patient Education  2019 [employer]. Losartan tablets What is this medicine? LOSARTAN (loe SAR tan) is used to treat high blood pressure and to reduce the risk of stroke in certain patients. This drug also slows the progression of kidney disease in patients with diabetes. This medicine may be used for other purposes; ask your health care provider or pharmacist if you have questions. COMMON BRAND NAME(S): Cozaar What should I tell my health care provider before I take this medicine? They need to know if you have any of these conditions: -heart failure -kidney or liver disease -an unusual or allergic reaction to  losartan, other medicines, foods, dyes, or preservatives -pregnant or trying to get pregnant -breast-feeding How should I use this medicine? Take this medicine by mouth with a glass of water. Follow the directions on the prescription label. This medicine can be taken with or without food. Take your doses at regular intervals. Do not take your medicine more often than directed. Talk to your pediatrician regarding the use of this medicine in children. Special care may be needed. Overdosage: If you think you have taken too much of this  medicine contact a poison control center or emergency room at once. NOTE: This medicine is only for you. Do not share this medicine with others. What if I miss a dose? If you miss a dose, take it as soon as you can. If it is almost time for your next dose, take only that dose. Do not take double or extra doses. What may interact with this medicine? -blood pressure medicines -diuretics, especially triamterene, spironolactone, or amiloride -fluconazole -NSAIDs, medicines for pain and inflammation, like ibuprofen or naproxen -potassium salts or potassium supplements -rifampin This list may not describe all possible interactions. Give your health care provider a list of all the medicines, herbs, non-prescription drugs, or dietary supplements you use. Also tell them if you smoke, drink alcohol, or use illegal drugs. Some items may interact with your medicine. What should I watch for while using this medicine? Visit your doctor or health care professional for regular checks on your progress. Check your blood pressure as directed. Ask your doctor or health care professional what your blood pressure should be and when you should contact him or her. Call your doctor or health care professional if you notice an irregular or fast heart beat. Women should inform their doctor if they wish to become pregnant or think they might be pregnant. There is a potential for serious side effects to an unborn child, particularly in the second or third trimester. Talk to your health care professional or pharmacist for more information. You may get drowsy or dizzy. Do not drive, use machinery, or do anything that needs mental alertness until you know how this drug affects you. Do not stand or sit up quickly, especially if you are an older patient. This reduces the risk of dizzy or fainting spells. Alcohol can make you more drowsy and dizzy. Avoid alcoholic drinks. Avoid salt substitutes unless you are told otherwise by your  doctor or health care professional. Do not treat yourself for coughs, colds, or pain while you are taking this medicine without asking your doctor or health care professional for advice. Some ingredients may increase your blood pressure. What side effects may I notice from receiving this medicine? Side effects that you should report to your doctor or health care professional as soon as possible: -confusion, dizziness, light headedness or fainting spells -decreased amount of urine passed -difficulty breathing or swallowing, hoarseness, or tightening of the throat -fast or irregular heart beat, palpitations, or chest pain -skin rash, itching -swelling of your face, lips, tongue, hands, or feet Side effects that usually do not require medical attention (report to your doctor or health care professional if they continue or are bothersome): -cough -decreased sexual function or desire -headache -nasal congestion or stuffiness -nausea or stomach pain -sore or cramping muscles This list may not describe all possible side effects. Call your doctor for medical advice about side effects. You may report side effects to FDA at 1-800-FDA-1088. Where should I keep my medicine? Keep out of the  reach of children. Store at room temperature between 15 and 30 degrees C (59 and 86 degrees F). Protect from light. Keep container tightly closed. Throw away any unused medicine after the expiration date. NOTE: This sheet is a summary. It may not cover all possible information. If you have questions about this medicine, talk to your doctor, pharmacist, or health care provider.  2019 Elsevier/Gold Standard (2007-03-08 16:42:18)

## 2018-01-23 ENCOUNTER — Encounter: Payer: Self-pay | Admitting: Adult Health

## 2018-01-23 ENCOUNTER — Ambulatory Visit: Payer: Self-pay | Admitting: Adult Health

## 2018-01-23 VITALS — BP 154/87 | HR 88 | Temp 98.1°F | Resp 16

## 2018-01-23 DIAGNOSIS — I10 Essential (primary) hypertension: Secondary | ICD-10-CM

## 2018-01-23 NOTE — Progress Notes (Addendum)
Subjective:     Patient ID: Micheal Holland, male   DOB: [birth year], 58 y.o.   MRN: [id number]  HPI  Blood pressure (!) 148/92, pulse 92, weight 234 lb (106.1 kg), SpO2 100 %. Patient is a 58 year old male in no acute distress who comes to the clinic for blood pressure check after starting Losartan 50 mg daily on 12/27/2017.  He reports he " feels fine " since starting medication and that he did not feel bad before. He reports he has taken Losartan when he remembers to take it and could have missed a few days here and there since starting.   Allergies  Allergen Reactions  . Other Other (See Comments)    Fresh fruit - makes throat itch, canned does not    Patient  denies any fever, body aches,chills, rash, chest pain, shortness of breath, nausea, vomiting, or diarrhea.  He denies edema. Denies any myalgias.  He denies any throat swelling, tongue or lip swelling and denies any changes in his voice.  Denies palpitations.  He is due for fasting labs 2/1 and is aware.  Declined Flu shot.  Will check on Tetanus last immunization in Epic shows 1983. He repoirts he has not had tetanus and would like to have at his next visit in 2 weeks. He verbalizes understanding of risks versus benefits.    Review of Systems  Constitutional: Negative.   HENT: Negative.   Respiratory: Negative.   Cardiovascular: Negative.   Gastrointestinal: Negative.   Genitourinary: Negative.   Musculoskeletal: Negative.   Skin: Negative.   Neurological: Negative.   Hematological: Negative.   Psychiatric/Behavioral: Negative.        Objective:   Physical Exam Vitals signs and nursing note reviewed.  Constitutional:      Appearance: Normal appearance.  HENT:     Head: Normocephalic and atraumatic.     Nose: Nose normal.     Mouth/Throat:     Mouth: Mucous membranes are moist.     Pharynx: No oropharyngeal exudate or posterior oropharyngeal erythema.  Eyes:     Extraocular Movements: Extraocular  movements intact.     Pupils: Pupils are equal, round, and reactive to light.  Neck:     Musculoskeletal: Full passive range of motion without pain, normal range of motion and neck supple.     Vascular: Normal carotid pulses. No carotid bruit or JVD.     Trachea: Trachea and phonation normal.  Cardiovascular:     Rate and Rhythm: Normal rate and regular rhythm.     Pulses: Normal pulses.     Heart sounds: Normal heart sounds. Heart sounds not distant. No murmur. No friction rub. No gallop.   Pulmonary:     Effort: Pulmonary effort is normal. No respiratory distress.     Breath sounds: Normal breath sounds. No stridor. No wheezing, rhonchi or rales.  Chest:     Chest wall: No tenderness.  Abdominal:     General: Abdomen is flat.  Musculoskeletal: Normal range of motion.     Right lower leg: No edema.     Left lower leg: No edema.  Lymphadenopathy:     Cervical: No cervical adenopathy.  Skin:    General: Skin is warm and dry.     Capillary Refill: Capillary refill takes less than 2 seconds.  Neurological:     Mental Status: He is alert and oriented to person, place, and time.  Psychiatric:        Mood and Affect:  Mood normal.        Thought Content: Thought content normal.        Judgment: Judgment normal.        Assessment:     Hypertension, unspecified type   Plan:  He reports he has not had a Tetanus shot in last 10 years and would like to wait and get it at his next two week follow up and lab appointment.   He denies any need for refills.   Continue same dose of Losartan as below.   Current Outpatient Medications:  .  aspirin EC 81 MG tablet, Take 1 tablet (81 mg total) by mouth daily., Disp: 90 tablet, Rfl: 3 .  ibuprofen (ADVIL,MOTRIN) 100 MG chewable tablet, Chew by mouth every 8 (eight) hours as needed., Disp: , Rfl:  .  losartan (COZAAR) 50 MG tablet, Take 1 tablet (50 mg total) by mouth daily., Disp: 90 tablet, Rfl: 0 .  rosuvastatin (CRESTOR) 40 MG tablet,  Take 1 tablet (40 mg total) by mouth daily., Disp: 90 tablet, Rfl: 0 .  sildenafil (REVATIO) 20 MG tablet, Take 1 tablet (20 mg total) by mouth as needed. Take 1-5 tabs as needed prior to intercourse, Disp: 30 tablet, Rfl: 11 .  VITAMIN D, ERGOCALCIFEROL, PO, Take by mouth 2 (two) times daily., Disp: , Rfl:  Reports not using Sildenafil ( discussed at safety risks )  Recheck blood pressure in 2 weeks follow up.  Labs due Fasting as near and after 02/09/2018. He will schedule lab appointment fasting and follow up appointment at next visit as well as receive Tetanus  immunization.   Provider thoroughly discussed in collaboration above plan with supervising physician Dr. Miguel Aschoff who is in agreement with the care plan as above.   Advised patient call the office or your primary care doctor for an appointment if no improvement within 72 hours or if any symptoms change or worsen at any time  Advised ER or urgent Care if after hours or on weekend. Call 911 for emergency symptoms at any time.Patinet verbalized understanding of all instructions given/reviewed and treatment plan and has no further questions or concerns at this time.    Patient verbalized understanding of all instructions given and denies any further questions at this time.  Discussed this provider last day is 01/25/17 and Daryll Drown PA-C and Brayton Layman FNP-C will be in the clinic to assume primary care if he prefers.  He is also aware of below if needed.  Provider also recommends patient see primary care physician for a routine physical and to establish primary care. Patient may chose provider of choice. Also gave the [facility] at [phone] or web site at [email or web address] to help assist with finding a primary care doctor. Patient understands this office is acute care and no longer taking new primary care patients.

## 2018-01-23 NOTE — Patient Instructions (Signed)

## 2018-02-06 ENCOUNTER — Ambulatory Visit: Payer: Self-pay | Admitting: Adult Health

## 2018-02-07 ENCOUNTER — Ambulatory Visit: Payer: Self-pay | Admitting: Medical

## 2018-02-07 ENCOUNTER — Encounter: Payer: Self-pay | Admitting: Medical

## 2018-02-07 VITALS — BP 132/84 | HR 88 | Temp 97.7°F | Resp 16 | Ht 74.0 in | Wt 231.0 lb

## 2018-02-07 DIAGNOSIS — Z0130 Encounter for examination of blood pressure without abnormal findings: Secondary | ICD-10-CM

## 2018-02-07 DIAGNOSIS — I10 Essential (primary) hypertension: Secondary | ICD-10-CM

## 2018-02-07 NOTE — Progress Notes (Signed)
   Subjective:    Patient ID: Micheal Holland, male    DOB: [birth year], 58 y.o.   MRN: [id number]  HPI  58 Yo male in non acute distress here for blood pressure check. He state he is taking the medication.   Blood pressure 132/84, pulse 88, temperature 97.7 F (36.5 C), resp. rate 16, height 6\' 2"  (1.88 m), weight 231 lb (104.8 kg), SpO2 98 %.  Allergies  Allergen Reactions  . Other Other (See Comments)    Fresh fruit - makes throat itch, canned does not    Review of Systems  Eyes: Negative for visual disturbance.  Respiratory: Negative for cough and shortness of breath.   Cardiovascular: Negative for chest pain.  Neurological: Negative for dizziness, syncope, numbness and headaches.   Fam Hx.    He has strong family history of premature coronary artery disease.  His father died at the age of 45 of myocardial infarction.  Mother died at the age of 59 of myocardial infarction in his brother had multiple strokes. He started exercising on the treadmill recently.  He reports mild exertional dyspnea without chest pain or palpitations.  No orthopnea, PND or leg edema. Last seen    Objective:   Physical Exam Vitals signs and nursing note reviewed.  Constitutional:      Appearance: Normal appearance.  HENT:     Head: Normocephalic and atraumatic.  Eyes:     Extraocular Movements: Extraocular movements intact.     Conjunctiva/sclera: Conjunctivae normal.     Pupils: Pupils are equal, round, and reactive to light.  Neck:     Musculoskeletal: Normal range of motion.  Cardiovascular:     Rate and Rhythm: Normal rate and regular rhythm.  Pulmonary:     Effort: Pulmonary effort is normal.     Breath sounds: Normal breath sounds.  Skin:    General: Skin is warm and dry.  Neurological:     Mental Status: He is alert.  Psychiatric:        Mood and Affect: Mood normal.        Behavior: Behavior normal.        Thought Content: Thought content normal.        Judgment: Judgment  normal.           Assessment & Plan:  Hypertension improved on Losartan.[name] in 2 weeks with [facility] FNP fro bp recheck and to meet [facility]. We discussed he is due for a follow up  (his annual) with Dr. Fletcher Anon cardiology. He asked for his name and number, both supplied to the patient. He states he needs no refills at this time.  He verbalizes understanding and has no questions  At discharge.Marland Kitchen

## 2018-02-11 ENCOUNTER — Encounter: Payer: Self-pay | Admitting: Medical

## 2018-02-11 ENCOUNTER — Ambulatory Visit: Payer: Self-pay | Admitting: Medical

## 2018-02-11 VITALS — BP 137/90 | HR 100 | Temp 97.9°F | Resp 16 | Wt 234.4 lb

## 2018-02-11 DIAGNOSIS — H6501 Acute serous otitis media, right ear: Secondary | ICD-10-CM

## 2018-02-11 DIAGNOSIS — J0100 Acute maxillary sinusitis, unspecified: Secondary | ICD-10-CM

## 2018-02-11 MED ORDER — AMOXICILLIN-POT CLAVULANATE 875-125 MG PO TABS: 1.0000 | ORAL_TABLET | Freq: Two times a day (BID) | ORAL | 0 refills | Status: DC

## 2018-02-11 NOTE — Progress Notes (Signed)
   Subjective:    Patient ID: Micheal Holland, male    DOB: 30-Nov-1960, 58 y.o.   MRN: [id number]  HPI Started yesterday  Afternoon with  Sore throat. Took Nyquil pil. Last night. Helped him sleep. Took no medications this morning.  Pain level 4/10.s  Denies fever or chills, , cough or shortness of breath or chest pain.   Review of Systems  Constitutional: Negative for chills and fever.  HENT: Positive for congestion, postnasal drip, rhinorrhea and sore throat. Negative for sinus pressure, sinus pain and sneezing.   Respiratory: Negative for cough, shortness of breath and wheezing.   Cardiovascular: Negative for chest pain.  Gastrointestinal: Negative for diarrhea, nausea and vomiting.  Musculoskeletal: Negative for myalgias.  Skin: Negative for rash.  Allergic/Immunologic: Positive for food allergies. Negative for environmental allergies.  Neurological: Negative for dizziness, syncope and light-headedness.       Objective:   Physical Exam Vitals signs and nursing note reviewed.  Constitutional:      Appearance: He is well-developed.  HENT:     Head: Normocephalic and atraumatic.     Jaw: There is normal jaw occlusion.     Right Ear: Hearing and ear canal normal. Tympanic membrane is erythematous.     Left Ear: Hearing and ear canal normal. A middle ear effusion is present.     Mouth/Throat:     Mouth: Mucous membranes are moist.     Pharynx: Posterior oropharyngeal erythema and uvula swelling present.     Tonsils: No tonsillar exudate or tonsillar abscesses. Swelling: 1+ on the right. 1+ on the left.  Eyes:     Conjunctiva/sclera: Conjunctivae normal.     Pupils: Pupils are equal, round, and reactive to light.  Neck:     Musculoskeletal: Normal range of motion.  Cardiovascular:     Rate and Rhythm: Normal rate and regular rhythm.     Heart sounds: Normal heart sounds.  Pulmonary:     Effort: Pulmonary effort is normal.     Breath sounds: Normal breath sounds.   Skin:    General: Skin is warm and dry.  Neurological:     General: No focal deficit present.     Mental Status: He is alert and oriented to person, place, and time.           Assessment & Plan:  Needs Tdap but is ill today so he cannot get it. Please give at next blood pressure recheck. Pharyngitis, otitis media right  Sinusitis Meds ordered this encounter  Medications  . amoxicillin-clavulanate (AUGMENTIN) 875-125 MG tablet    Sig: Take 1 tablet by mouth 2 (two) times daily.    Dispense:  14 tablet    Refill:  0  Take OTC Motrin for pain per package instructions. Soft foods avoid acidic foods, return in 3- 5 days if not improving.  Dilute salt water gargles 3-4 times per day. Patient declines AVS, he verbalizes understanding and has no questions at discharge.

## 2018-02-21 ENCOUNTER — Ambulatory Visit: Payer: Self-pay | Admitting: Nurse Practitioner

## 2018-02-25 ENCOUNTER — Ambulatory Visit: Payer: Self-pay | Admitting: Nurse Practitioner

## 2018-02-25 ENCOUNTER — Encounter: Payer: Self-pay | Admitting: Nurse Practitioner

## 2018-02-25 VITALS — BP 132/97 | HR 89 | Temp 97.7°F | Resp 16 | Wt 232.4 lb

## 2018-02-25 DIAGNOSIS — R7303 Prediabetes: Secondary | ICD-10-CM

## 2018-02-25 DIAGNOSIS — I10 Essential (primary) hypertension: Secondary | ICD-10-CM

## 2018-02-25 DIAGNOSIS — Z6829 Body mass index (BMI) 29.0-29.9, adult: Secondary | ICD-10-CM

## 2018-02-25 DIAGNOSIS — E559 Vitamin D deficiency, unspecified: Secondary | ICD-10-CM

## 2018-02-25 DIAGNOSIS — E785 Hyperlipidemia, unspecified: Secondary | ICD-10-CM

## 2018-02-25 MED ORDER — LOSARTAN POTASSIUM 50 MG PO TABS: 50.0000 mg | ORAL_TABLET | Freq: Every day | ORAL | 0 refills | Status: DC

## 2018-02-25 MED ORDER — ROSUVASTATIN CALCIUM 40 MG PO TABS: 40.0000 mg | ORAL_TABLET | Freq: Every day | ORAL | 0 refills | Status: DC

## 2018-02-25 NOTE — Patient Instructions (Addendum)
Discussed need to add another med to assist with lowering blood pressure and discussed risk factors of heart attack and stroke today but he refuses and reports he's not afraid to die but he will not take another med that may affect his sex drive. Discussed that this may not occur and since he's taking a med for blood pressure and on sildenafil consider trying Encouraged healthy eating habits with modifying carbs and cholesterol with more baked lean foods Return to clinic in the am for labs (CBC, CMP, vitamin d, lipid, a1c, urine micro) Encouraged to take current medications as directed and stressed importance of this

## 2018-02-25 NOTE — Progress Notes (Signed)
   Subjective:    Patient ID: Micheal Holland, male    DOB: 1960/10/28, 58 y.o.   MRN: [id number]  HPI Amori is here today for follow up on HTN, impaired fasting glucose and hyperlipidemia.  He reports he takes his b/p med 4-5 times a week and tolerates. Had concern about ED and was seen by urology which they have put him on sidenafil to assist with this. He denies taking cholesterol med but agrees to resume this medication. Takes asa 81 mg PO most of the week. Does not follow a heart healthy low sodium diet and contributes unhealthy eating habits to busy schedule. Reports he does not add salt to his diet unless his food needs it. Denies SOB, C/p, dizziness, blurred vision, or headache.  Admits father died of heart attack before 83  Review of Systems  Eyes: Negative for visual disturbance.  Respiratory: Negative for shortness of breath.   Cardiovascular: Negative for chest pain.  Neurological: Negative for headaches.       Objective:   Physical Exam Vitals signs reviewed.  Constitutional:      Appearance: Normal appearance. He is well-developed.  HENT:     Head: Normocephalic and atraumatic.  Neck:     Musculoskeletal: Normal range of motion and neck supple.     Vascular: No carotid bruit.  Cardiovascular:     Rate and Rhythm: Normal rate and regular rhythm.     Pulses: Normal pulses.     Heart sounds: Normal heart sounds.     Comments: No lower ext. edema Pulmonary:     Effort: Pulmonary effort is normal. No respiratory distress.     Breath sounds: Normal breath sounds.  Abdominal:     General: Abdomen is flat. Bowel sounds are normal.     Palpations: Abdomen is soft.  Musculoskeletal: Normal range of motion.  Skin:    General: Skin is warm and dry.  Neurological:     Mental Status: He is alert and oriented to person, place, and time.  Psychiatric:        Mood and Affect: Mood normal.           Assessment & Plan:

## 2018-02-27 ENCOUNTER — Other Ambulatory Visit: Payer: Self-pay

## 2018-02-28 ENCOUNTER — Other Ambulatory Visit: Payer: Self-pay

## 2018-02-28 DIAGNOSIS — R7303 Prediabetes: Secondary | ICD-10-CM

## 2018-02-28 DIAGNOSIS — E559 Vitamin D deficiency, unspecified: Secondary | ICD-10-CM

## 2018-02-28 DIAGNOSIS — I10 Essential (primary) hypertension: Secondary | ICD-10-CM

## 2018-02-28 DIAGNOSIS — E785 Hyperlipidemia, unspecified: Secondary | ICD-10-CM

## 2018-03-01 ENCOUNTER — Telehealth: Payer: Self-pay

## 2018-03-01 ENCOUNTER — Other Ambulatory Visit: Payer: Self-pay | Admitting: Nurse Practitioner

## 2018-03-01 DIAGNOSIS — E559 Vitamin D deficiency, unspecified: Secondary | ICD-10-CM

## 2018-03-01 LAB — CMP12+LP+TP+TSH+6AC+PSA+CBC…
ALT: 20 IU/L (ref 0–44)
AST: 29 IU/L (ref 0–40)
Albumin/Globulin Ratio: 2 (ref 1.2–2.2)
Albumin: 4.7 g/dL (ref 3.8–4.9)
Alkaline Phosphatase: 65 IU/L (ref 39–117)
BUN/Creatinine Ratio: 12 (ref 9–20)
BUN: 14 mg/dL (ref 6–24)
Basophils Absolute: 0.1 10*3/uL (ref 0.0–0.2)
Basos: 1 %
Bilirubin Total: 0.4 mg/dL (ref 0.0–1.2)
Calcium: 9.5 mg/dL (ref 8.7–10.2)
Chloride: 103 mmol/L (ref 96–106)
Chol/HDL Ratio: 4.8 ratio (ref 0.0–5.0)
Cholesterol, Total: 257 mg/dL — ABNORMAL HIGH (ref 100–199)
Creatinine, Ser: 1.18 mg/dL (ref 0.76–1.27)
EOS (ABSOLUTE): 0.4 10*3/uL (ref 0.0–0.4)
Eos: 8 %
Estimated CHD Risk: 1 times avg. (ref 0.0–1.0)
Free Thyroxine Index: 1.7 (ref 1.2–4.9)
GFR calc Af Amer: 80 mL/min/{1.73_m2} (ref >59)
GFR calc non Af Amer: 69 mL/min/{1.73_m2} (ref >59)
GGT: 46 IU/L (ref 0–65)
Globulin, Total: 2.4 g/dL (ref 1.5–4.5)
Glucose: 97 mg/dL (ref 65–99)
HDL: 53 mg/dL (ref >39)
Hematocrit: 41.6 % (ref 37.5–51.0)
Hemoglobin: 14.4 g/dL (ref 13.0–17.7)
Immature Grans (Abs): 0 10*3/uL (ref 0.0–0.1)
Immature Granulocytes: 0 %
Iron: 61 ug/dL (ref 38–169)
LDH: 233 IU/L — ABNORMAL HIGH (ref 121–224)
LDL Calculated: 164 mg/dL — ABNORMAL HIGH (ref 0–99)
Lymphocytes Absolute: 1.6 10*3/uL (ref 0.7–3.1)
Lymphs: 32 %
MCH: 29.8 pg (ref 26.6–33.0)
MCHC: 34.6 g/dL (ref 31.5–35.7)
MCV: 86 fL (ref 79–97)
Monocytes Absolute: 0.5 10*3/uL (ref 0.1–0.9)
Monocytes: 9 %
Neutrophils Absolute: 2.4 10*3/uL (ref 1.4–7.0)
Neutrophils: 50 %
Phosphorus: 4.1 mg/dL (ref 2.8–4.1)
Platelets: 325 10*3/uL (ref 150–450)
Potassium: 4.4 mmol/L (ref 3.5–5.2)
Prostate Specific Ag, Serum: 0.3 ng/mL (ref 0.0–4.0)
RBC: 4.84 x10E6/uL (ref 4.14–5.80)
RDW: 13.7 % (ref 11.6–15.4)
Sodium: 140 mmol/L (ref 134–144)
T3 Uptake Ratio: 30 % (ref 24–39)
T4, Total: 5.8 ug/dL (ref 4.5–12.0)
TSH: 3.09 u[IU]/mL (ref 0.450–4.500)
Total Protein: 7.1 g/dL (ref 6.0–8.5)
Triglycerides: 200 mg/dL — ABNORMAL HIGH (ref 0–149)
Uric Acid: 6.8 mg/dL (ref 3.7–8.6)
VLDL Cholesterol Cal: 40 mg/dL (ref 5–40)
WBC: 5 10*3/uL (ref 3.4–10.8)

## 2018-03-01 LAB — HEMOGLOBIN A1C
Est. average glucose Bld gHb Est-mCnc: 123 mg/dL
Hgb A1c MFr Bld: 5.9 % — ABNORMAL HIGH (ref 4.8–5.6)

## 2018-03-01 LAB — VITAMIN D 25 HYDROXY (VIT D DEFICIENCY, FRACTURES): Vit D, 25-Hydroxy: 10.6 ng/mL — ABNORMAL LOW (ref 30.0–100.0)

## 2018-03-01 LAB — MICROALBUMIN / CREATININE URINE RATIO
Creatinine, Urine: 251.8 mg/dL
Microalb/Creat Ratio: 18 mg/g creat (ref 0–29)
Microalbumin, Urine: 44.4 ug/mL

## 2018-03-01 LAB — CMP14+EGFR: CO2: 18 mmol/L — ABNORMAL LOW (ref 20–29)

## 2018-03-01 MED ORDER — VITAMIN D (ERGOCALCIFEROL) 1.25 MG (50000 UNIT) PO CAPS: ORAL_CAPSULE | ORAL | 0 refills | Status: DC

## 2018-03-01 NOTE — Progress Notes (Signed)
Thanks, I will review.

## 2018-03-01 NOTE — Progress Notes (Signed)
Hey I received Sabastion's labs today 03/01/18 to my in basket. Wanted to forward to you all. Please let me know you received.  Thanks, Laverna Peace MSN, AGNP-C, FNP-C

## 2018-03-01 NOTE — Telephone Encounter (Signed)
Contacted pt to notify him to pick up his prescription for Vitamin D and Cholesterol medication. He is to follow up with [place] in 3 months for a Vitamin D level.  Marliss Coots will contact him on Monday to schedule an appointment.

## 2018-03-01 NOTE — Progress Notes (Signed)
Thank you :)

## 2018-03-14 ENCOUNTER — Encounter: Payer: Self-pay | Admitting: Nurse Practitioner

## 2018-03-14 ENCOUNTER — Other Ambulatory Visit: Payer: Self-pay

## 2018-03-14 ENCOUNTER — Ambulatory Visit: Payer: Self-pay | Admitting: Nurse Practitioner

## 2018-03-14 VITALS — BP 140/92 | HR 98 | Temp 98.6°F | Resp 18 | Ht 75.0 in | Wt 236.0 lb

## 2018-03-14 DIAGNOSIS — R200 Anesthesia of skin: Secondary | ICD-10-CM

## 2018-03-14 DIAGNOSIS — M25642 Stiffness of left hand, not elsewhere classified: Secondary | ICD-10-CM

## 2018-03-14 NOTE — Patient Instructions (Signed)
Soak hand in warm Epsom salt Continue Tylenol 500 mg PO BID Continue using stress ball to stretch Stay away from NSAIDS; if no improvement will consider PO steroids Encouraged patient to call the office or primary care doctor for an appointment if no improvement in symptoms or if symptoms change or worsen after 2 weeks of planned treatment. Patient verbalized understanding of all instructions given/reviewed and has no further questions or concerns at this time.

## 2018-03-14 NOTE — Progress Notes (Signed)
   Subjective:    Patient ID: Micheal Holland, male    DOB: 1960/01/17, 58 y.o.   MRN: [id number]  HPI Nima is here today with c/o left 2-4th digit numbness and stiffness x 3 days. He reports he woke up with this discomfort and it is intermittant. He denies any pain or issues with movement. Is using stress ball and taking 500mg  PO Tylenol twice daily with relief. Denies injury.  Review of Systems  Musculoskeletal: Negative for arthralgias, joint swelling and myalgias.       Left finger joint stiffness and numbness       Objective:   Physical Exam Constitutional:      Appearance: Normal appearance.  Neck:     Musculoskeletal: Neck supple.  Cardiovascular:     Comments: Radials 2+ Musculoskeletal: Normal range of motion.        General: No swelling, tenderness or signs of injury.     Comments: Negative tinnel/phalens FROM to Left hand with no tenderness or crepitus on palpation. Strength 5/5  Skin:    General: Skin is warm and dry.  Neurological:     Mental Status: He is alert.  Psychiatric:        Mood and Affect: Mood normal.           Assessment & Plan:

## 2018-03-28 NOTE — Telephone Encounter (Signed)
Contacted patient to be sure he is taking his cholesterol medication and Vitamin D3.  He states that he is taking the medication and he is to return to the clinic in 3 months for lab work that is already scheduled.

## 2018-05-16 ENCOUNTER — Other Ambulatory Visit: Payer: Self-pay

## 2018-05-16 DIAGNOSIS — R809 Proteinuria, unspecified: Secondary | ICD-10-CM

## 2018-05-16 DIAGNOSIS — I10 Essential (primary) hypertension: Secondary | ICD-10-CM

## 2018-05-16 DIAGNOSIS — E559 Vitamin D deficiency, unspecified: Secondary | ICD-10-CM

## 2018-05-16 LAB — POCT URINALYSIS DIPSTICK
Appearance: NORMAL
Bilirubin, UA: NEGATIVE
Blood, UA: NEGATIVE
Glucose, UA: NEGATIVE
Ketones, UA: NEGATIVE
Leukocytes, UA: NEGATIVE
Nitrite, UA: 0.2
Protein, UA: NEGATIVE
Spec Grav, UA: 1.01 (ref 1.010–1.025)
Urobilinogen, UA: 1 E.U./dL
pH, UA: 6 (ref 5.0–8.0)

## 2018-05-17 ENCOUNTER — Other Ambulatory Visit: Payer: Self-pay | Admitting: Nurse Practitioner

## 2018-05-17 ENCOUNTER — Telehealth: Payer: Self-pay

## 2018-05-17 DIAGNOSIS — E559 Vitamin D deficiency, unspecified: Secondary | ICD-10-CM

## 2018-05-17 LAB — VITAMIN D 25 HYDROXY (VIT D DEFICIENCY, FRACTURES): Vit D, 25-Hydroxy: 15.6 ng/mL — ABNORMAL LOW (ref 30.0–100.0)

## 2018-05-17 LAB — MICROALBUMIN, URINE: Microalbumin, Urine: 37.8 ug/mL

## 2018-05-17 MED ORDER — VITAMIN D (ERGOCALCIFEROL) 1.25 MG (50000 UNIT) PO CAPS: ORAL_CAPSULE | ORAL | 0 refills | Status: DC

## 2018-05-17 NOTE — Telephone Encounter (Signed)
Contacted patient to let him know his U/A was normal and the micro results are pending.  He was instructed to increase his Vitamin D to 50000 iu twice a week and take on Mon/Thurs or Tues/Sat.  A new prescription has been called in to the pharmacy to accommodate this change. Patient verbalizes understanding and has no further questions.

## 2018-05-23 ENCOUNTER — Telehealth: Payer: Self-pay | Admitting: Nurse Practitioner

## 2018-05-23 ENCOUNTER — Other Ambulatory Visit: Payer: Self-pay

## 2018-05-23 ENCOUNTER — Telehealth: Payer: Self-pay

## 2018-05-23 DIAGNOSIS — M2550 Pain in unspecified joint: Secondary | ICD-10-CM

## 2018-05-23 MED ORDER — PREDNISONE 20 MG PO TABS: 20.0000 mg | ORAL_TABLET | Freq: Every day | ORAL | 0 refills | Status: DC

## 2018-05-23 NOTE — Telephone Encounter (Signed)
Call for e visit appt, he agreed to next Thursday 5/21. Reviewed procedure for e visit call.    CONSENT FOR TELE-HEALTH VISIT - PLEASE REVIEW  I hereby voluntarily request, consent and authorize [facility] and its employed or contracted physicians, physician assistants, nurse practitioners or other licensed health care professionals (the Practitioner), to provide me with telemedicine health care services (the "Services") as deemed necessary by the treating Practitioner. I acknowledge and consent to receive the Services by the Practitioner via telemedicine. I understand that the telemedicine visit will involve communicating with the Practitioner through live audiovisual communication technology and the disclosure of certain medical information by electronic transmission. I acknowledge that I have been given the opportunity to request an in-person assessment or other available alternative prior to the telemedicine visit and am voluntarily participating in the telemedicine visit.  Pt verbally agreed.   I understand that I have the right to withhold or withdraw my consent to the use of telemedicine in the course of my care at any time, without affecting my right to future care or treatment, and that the Practitioner or I may terminate the telemedicine visit at any time. I understand that I have the right to inspect all information obtained and/or recorded in the course of the telemedicine visit and may receive copies of available information for a reasonable fee.  I understand that some of the potential risks of receiving the Services via telemedicine include:  Marland Kitchen Delay or interruption in medical evaluation due to technological equipment failure or disruption; . Information transmitted may not be sufficient (e.g. poor resolution of images) to allow for appropriate medical decision making by the Practitioner; and/or  . In rare instances, security protocols could fail, causing a breach of personal health  information.  Furthermore, I acknowledge that it is my responsibility to provide information about my medical history, conditions and care that is complete and accurate to the best of my ability. I acknowledge that Practitioner's advice, recommendations, and/or decision may be based on factors not within their control, such as incomplete or inaccurate data provided by me or distortions of diagnostic images or specimens that may result from electronic transmissions. I understand that the practice of medicine is not an exact science and that Practitioner makes no warranties or guarantees regarding treatment outcomes. I acknowledge that I will receive a copy of this consent concurrently upon execution via email to the email address I last provided but may also request a printed copy by calling the office of [facility].    I understand that my insurance will be billed for this visit.   I have read or had this consent read to me. . I understand the contents of this consent, which adequately explains the benefits and risks of the Services being provided via telemedicine.  . I have been provided ample opportunity to ask questions regarding this consent and the Services and have had my questions answered to my satisfaction. . I give my informed consent for the services to be provided through the use of telemedicine in my medical care  By participating in this telemedicine visit I agree to the above.   Pt verbally agreed.

## 2018-05-23 NOTE — Progress Notes (Signed)
   Subjective:    Patient ID: Micheal Holland, male    DOB: 09-Oct-1960, 58 y.o.   MRN: [id number]  HPI Micheal Holland is on a telephonic visit with c/o leg and feet pain for 2 weeks. He denies taking Crestor. He reports this pain is from his thigh up to his hips and in his feet. Denies swelling,  warmth , redness, numbness or tingling or injury. Rates pain 8/10 that is "shock". He reports his legs and feet do not hurt when he wears dress shoes, or on wood/carpet. Only when he's standing on concrete or hard surface. Has taken Tylenol with short term relief. He has no problems sleeping and able to do regular activity and work. Denies fever, SOB, cough.  Review of Systems  Musculoskeletal: Positive for arthralgias. Negative for joint swelling.   Verbal consent to treat given    Objective:   Physical Exam Answers questions appropriately and promptly       Assessment & Plan:

## 2018-05-23 NOTE — Patient Instructions (Signed)
Change shoe inserts Use heat to affected areas Continue Tylenol 2 (500mg ) tabs every 8 hours as needed If no improvement after completion of treatment come into the office to be seen; discussed possible referral to ortho Will do a 5 day trial of prednisone with hx of blood pressure not being in a good range to do NSAIDS Encouraged patient to call the office or primary care doctor for an appointment if no improvement in symptoms or if symptoms change or worsen after 72 hours of planned treatment. Patient verbalized understanding of all instructions given/reviewed and has no further questions or concerns at this time.

## 2018-05-30 ENCOUNTER — Telehealth: Payer: BLUE CROSS/BLUE SHIELD | Admitting: Nurse Practitioner

## 2018-08-08 ENCOUNTER — Other Ambulatory Visit: Payer: Self-pay | Admitting: Nurse Practitioner

## 2018-08-08 DIAGNOSIS — Z0000 Encounter for general adult medical examination without abnormal findings: Secondary | ICD-10-CM

## 2018-08-12 ENCOUNTER — Encounter: Payer: Self-pay | Admitting: Emergency Medicine

## 2018-08-12 ENCOUNTER — Emergency Department
Admission: EM | Admit: 2018-08-12 | Discharge: 2018-08-12 | Disposition: A | Discharge status: Home / Self Care | Payer: BLUE CROSS/BLUE SHIELD | Attending: Emergency Medicine | Admitting: Emergency Medicine

## 2018-08-12 ENCOUNTER — Other Ambulatory Visit: Payer: Self-pay

## 2018-08-12 ENCOUNTER — Emergency Department: Payer: BLUE CROSS/BLUE SHIELD

## 2018-08-12 DIAGNOSIS — E785 Hyperlipidemia, unspecified: Secondary | ICD-10-CM | POA: Insufficient documentation

## 2018-08-12 DIAGNOSIS — Y9289 Other specified places as the place of occurrence of the external cause: Secondary | ICD-10-CM | POA: Insufficient documentation

## 2018-08-12 DIAGNOSIS — Y998 Other external cause status: Secondary | ICD-10-CM | POA: Diagnosis not present

## 2018-08-12 DIAGNOSIS — I10 Essential (primary) hypertension: Secondary | ICD-10-CM | POA: Insufficient documentation

## 2018-08-12 DIAGNOSIS — Y9389 Activity, other specified: Secondary | ICD-10-CM | POA: Insufficient documentation

## 2018-08-12 DIAGNOSIS — S39012A Strain of muscle, fascia and tendon of lower back, initial encounter: Secondary | ICD-10-CM | POA: Diagnosis not present

## 2018-08-12 DIAGNOSIS — S3992XA Unspecified injury of lower back, initial encounter: Secondary | ICD-10-CM | POA: Diagnosis present

## 2018-08-12 DIAGNOSIS — X503XXA Overexertion from repetitive movements, initial encounter: Secondary | ICD-10-CM | POA: Diagnosis not present

## 2018-08-12 DIAGNOSIS — M545 Low back pain: Secondary | ICD-10-CM | POA: Diagnosis not present

## 2018-08-12 MED ORDER — ORPHENADRINE CITRATE ER 100 MG PO TB12: 100.0000 mg | ORAL_TABLET | Freq: Two times a day (BID) | ORAL | 0 refills | Status: DC

## 2018-08-12 MED ORDER — NAPROXEN 500 MG PO TABS: 500.0000 mg | ORAL_TABLET | Freq: Two times a day (BID) | ORAL | Status: DC

## 2018-08-12 NOTE — ED Notes (Signed)
See triage note  Presents with lower back pain  States pain started in June  States he does a lot of lifting and bending d/t work  Denies any fall  States pain is in lower back and moves into both legs  Ambulates well to treatment room

## 2018-08-12 NOTE — ED Provider Notes (Signed)
[facility] Emergency Department Provider Note   ____________________________________________   First MD Initiated Contact with Patient 08/12/18 0827     (approximate)  I have reviewed the triage vital signs and the nursing notes.   HISTORY  Chief Complaint Back Pain    HPI Micheal Holland is a 58 y.o. male patient complain of bilateral low back pain for 2 months.  Patient stated no specific provocative incident for complaint.  Patient relates fairly heavy lifting secondary to job.  Patient states no radicular component to his back pain.  Patient denies bladder bowel dysfunction.  Patient rates pain as a 9/10.  Patient scribed pain is "aching".  No palliative measure for complaint.         Past Medical History:  Diagnosis Date  . Allergy   . Hyperlipidemia   . Hypertension 12/26/2017    Patient Active Problem List   Diagnosis Date Noted  . Hypertension 12/26/2017  . Elevated coronary artery calcium score 01/29/2017  . Vitamin D deficiency 10/24/2016  . Alcohol use 10/24/16  . Prediabetes October 24, 2016  . Hyperlipidemia 10/24/2016  . Family history of death due to heart problem at 60 years of age or younger 10-24-2016  . History of farsightedness 24-Oct-2016    Past Surgical History:  Procedure Laterality Date  . COLONOSCOPY WITH PROPOFOL N/A 04/03/2016   Procedure: COLONOSCOPY WITH PROPOFOL;  Surgeon: Lollie Sails, MD;  Location: [facility] ENDOSCOPY;  Service: Endoscopy;  Laterality: N/A;  . none      Prior to Admission medications   Medication Sig Start Date End Date Taking? Authorizing Provider  aspirin EC 81 MG tablet Take 1 tablet (81 mg total) by mouth daily. 11/07/17   Flinchum, Kelby Aline, FNP  ibuprofen (ADVIL,MOTRIN) 100 MG chewable tablet Chew by mouth every 8 (eight) hours as needed.    [provider]  losartan (COZAAR) 50 MG tablet Take 1 tablet (50 mg total) by mouth daily. 02/25/18   Maury Dus, NP   naproxen (NAPROSYN) 500 MG tablet Take 1 tablet (500 mg total) by mouth 2 (two) times daily with a meal. 08/12/18   Sable Feil, PA-C  orphenadrine (NORFLEX) 100 MG tablet Take 1 tablet (100 mg total) by mouth 2 (two) times daily. 08/12/18   Sable Feil, PA-C  rosuvastatin (CRESTOR) 40 MG tablet Take 1 tablet (40 mg total) by mouth daily. 02/25/18 05/26/18  Maury Dus, NP  sildenafil (REVATIO) 20 MG tablet Take 1 tablet (20 mg total) by mouth as needed. Take 1-5 tabs as needed prior to intercourse 11/29/17   Hollice Espy, MD  Vitamin D, Ergocalciferol, (DRISDOL) 1.25 MG (50000 UT) CAPS capsule 1 capsule PO twice a week x 12 weeks 05/17/18   Maury Dus, NP    Allergies Other  Family History  Problem Relation Age of Onset  . Hypertension Mother        heart disease died 45   . Heart disease Mother   . Heart attack Mother   . Heart disease Father        "hole  behind heart15 years old died from this "  . Hypertension Father   . Heart attack Father   . Kidney disease Sister        one sister has had transplant kidney / one sister died with tumor was J. witness did not do surgery  . Heart Problems Sister   . Heart disease Brother        heart disease "hole  behind heart"  . Heart attack Brother     Social History Social History   Tobacco Use  . Smoking status: Never Smoker  . Smokeless tobacco: Never Used  Substance Use Topics  . Alcohol use: Yes    Alcohol/week: 4.0 standard drinks    Types: 4 Cans of beer per week    Comment: Beer daily 2- 40 oz  . Drug use: No    Review of Systems Constitutional: No fever/chills Eyes: No visual changes. ENT: No sore throat. Cardiovascular: Denies chest pain. Respiratory: Denies shortness of breath. Gastrointestinal: No abdominal pain.  No nausea, no vomiting.  No diarrhea.  No constipation. Genitourinary: Negative for dysuria. Musculoskeletal: Positive for back pain. Skin: Negative for rash. Neurological:  Negative for headaches, focal weakness or numbness. Endocrine: Hyperlipidemia hypertension.  ____________________________________________   PHYSICAL EXAM:  VITAL SIGNS: ED Triage Vitals  Enc Vitals Group     BP 08/12/18 0825 (!) 146/96     Pulse Rate 08/12/18 0825 93     Resp 08/12/18 0825 16     Temp 08/12/18 0825 98.2 F (36.8 C)     Temp Source 08/12/18 0825 Oral     SpO2 08/12/18 0825 96 %     Weight 08/12/18 0822 220 lb (99.8 kg)     Height 08/12/18 0822 6\' 2"  (1.88 m)     Head Circumference --      Peak Flow --      Pain Score 08/12/18 0822 9     Pain Loc --      Pain Edu? --      Excl. in [place]? --    Constitutional: Alert and oriented. Well appearing and in no acute distress. Neck: No stridor.   Hematological/Lymphatic/Immunilogical: No cervical lymphadenopathy. Cardiovascular: Normal rate, regular rhythm. Grossly normal heart sounds.  Good peripheral circulation. Respiratory: Normal respiratory effort.  No retractions. Lungs CTAB. Gastrointestinal: Soft and nontender. No distention. No abdominal bruits. No CVA tenderness. Musculoskeletal: No obvious spinal deformity.  No guarding palpation spinal processes.  Patient is decreased range of motion with lateral movements.  Patient had bilateral proximal spinal muscle spasm with lateral flexion movements.  Neurologic:  Normal speech and language. No gross focal neurologic deficits are appreciated. No gait instability. Skin:  Skin is warm, dry and intact. No rash noted. Psychiatric: Mood and affect are normal. Speech and behavior are normal.  ____________________________________________   LABS (all labs ordered are listed, but only abnormal results are displayed)  Labs Reviewed - No data to display ____________________________________________  EKG   ____________________________________________  RADIOLOGY  ED MD interpretation:    Official radiology report(s): Dg Lumbar Spine 2-3 Views  Result Date: 08/12/2018  CLINICAL DATA:  Lumbago EXAM: LUMBAR SPINE - 2-3 VIEW COMPARISON:  None. FINDINGS: Frontal, lateral, and spot lumbosacral lateral images were obtained. There are 5 non-rib-bearing lumbar type vertebral bodies. There is no fracture or spondylolisthesis. Disc spaces appear unremarkable. No erosive change. IMPRESSION: No fracture or spondylolisthesis. No appreciable arthropathic change. Electronically Signed   By: Lowella Grip III M.D.   On: 08/12/2018 09:08    ____________________________________________   PROCEDURES  Procedure(s) performed (including Critical Care):  Procedures   ____________________________________________   INITIAL IMPRESSION / ASSESSMENT AND PLAN / ED COURSE  As part of my medical decision making, I reviewed the following data within the [place] was evaluated in Emergency Department on 08/12/2018 for the symptoms described in the history  of present illness. He was evaluated in the context of the global COVID-19 pandemic, which necessitated consideration that the patient might be at risk for infection with the SARS-CoV-2 virus that causes COVID-19. Institutional protocols and algorithms that pertain to the evaluation of patients at risk for COVID-19 are in a state of rapid change based on information released by regulatory bodies including the CDC and federal and state organizations. These policies and algorithms were followed during the patient's care in the ED.  Patient presented with 2 months of bilateral back pain secondary to repetitive heavy lifting.  Physical exam was grossly unremarkable except for paraspinal muscle spasm.  Discussed neck x-ray findings with patient.  Patient given discharge care instruction advised to wear elastic lumbar support while working.  Patient advised follow-up PCP.       ____________________________________________   FINAL CLINICAL IMPRESSION(S) / ED DIAGNOSES  Final diagnoses:   Strain of lumbar region, initial encounter     ED Discharge Orders         Ordered    naproxen (NAPROSYN) 500 MG tablet  2 times daily with meals,   Status:  Discontinued     08/12/18 0929    orphenadrine (NORFLEX) 100 MG tablet  2 times daily,   Status:  Discontinued     08/12/18 0929    naproxen (NAPROSYN) 500 MG tablet  2 times daily with meals     08/12/18 0930    orphenadrine (NORFLEX) 100 MG tablet  2 times daily     08/12/18 0930           Note:  This document was prepared using Dragon voice recognition software and may include unintentional dictation errors.    Sable Feil, PA-C 08/12/18 Emmaline Life    Arta Silence, MD 08/13/18 [phone]

## 2018-08-12 NOTE — ED Triage Notes (Signed)
Pt reports lower back since June. Pt denies injuries but states does heavy lifting at work. Pt does not wish to file WC at this time.

## 2018-08-12 NOTE — Discharge Instructions (Signed)
Advised lumbar elastic support while working.

## 2018-09-18 ENCOUNTER — Ambulatory Visit: Payer: Self-pay | Admitting: Nurse Practitioner

## 2018-09-19 ENCOUNTER — Other Ambulatory Visit: Payer: Self-pay

## 2018-09-19 ENCOUNTER — Encounter: Payer: Self-pay | Admitting: Nurse Practitioner

## 2018-09-19 ENCOUNTER — Ambulatory Visit: Payer: Self-pay | Admitting: Nurse Practitioner

## 2018-09-19 VITALS — BP 142/89 | HR 92 | Temp 98.6°F | Resp 20 | Ht 74.0 in | Wt 237.0 lb

## 2018-09-19 DIAGNOSIS — R7303 Prediabetes: Secondary | ICD-10-CM

## 2018-09-19 DIAGNOSIS — M545 Low back pain, unspecified: Secondary | ICD-10-CM

## 2018-09-19 DIAGNOSIS — E785 Hyperlipidemia, unspecified: Secondary | ICD-10-CM

## 2018-09-19 DIAGNOSIS — R809 Proteinuria, unspecified: Secondary | ICD-10-CM

## 2018-09-19 DIAGNOSIS — Z23 Encounter for immunization: Secondary | ICD-10-CM

## 2018-09-19 DIAGNOSIS — I10 Essential (primary) hypertension: Secondary | ICD-10-CM

## 2018-09-19 DIAGNOSIS — G8929 Other chronic pain: Secondary | ICD-10-CM

## 2018-09-19 DIAGNOSIS — E559 Vitamin D deficiency, unspecified: Secondary | ICD-10-CM

## 2018-09-19 DIAGNOSIS — Z2829 Immunization not carried out because of patient decision for other reason: Secondary | ICD-10-CM

## 2018-09-19 DIAGNOSIS — Z0000 Encounter for general adult medical examination without abnormal findings: Secondary | ICD-10-CM

## 2018-09-19 LAB — POCT URINALYSIS DIPSTICK
Appearance: NORMAL
Bilirubin, UA: NEGATIVE
Blood, UA: NEGATIVE
Glucose, UA: NEGATIVE
Ketones, UA: NEGATIVE
Leukocytes, UA: NEGATIVE
Nitrite, UA: NEGATIVE
Protein, UA: NEGATIVE
Spec Grav, UA: 1.01 (ref 1.010–1.025)
Urobilinogen, UA: 0.2 E.U./dL
pH, UA: 6 (ref 5.0–8.0)

## 2018-09-19 MED ORDER — ORPHENADRINE CITRATE ER 100 MG PO TB12: ORAL_TABLET | ORAL | 0 refills | Status: DC

## 2018-09-19 NOTE — Patient Instructions (Addendum)
Return to the clinic in about 3 months for physical I will call you about your labs when they return Continue medication as directed I have sent in a refill of the muscle relaxer you were taking and encourage you to use heat as well with stretching   Acute Back Pain, Adult Acute back pain is sudden and usually short-lived. It is often caused by an injury to the muscles and tissues in the back. The injury may result from:  A muscle or ligament getting overstretched or torn (strained). Ligaments are tissues that connect bones to each other. Lifting something improperly can cause a back strain.  Wear and tear (degeneration) of the spinal disks. Spinal disks are circular tissue that provides cushioning between the bones of the spine (vertebrae).  Twisting motions, such as while playing sports or doing yard work.  A hit to the back.  Arthritis. You may have a physical exam, lab tests, and imaging tests to find the cause of your pain. Acute back pain usually goes away with rest and home care. Follow these instructions at home: Managing pain, stiffness, and swelling  Take over-the-counter and prescription medicines only as told by your health care provider.  Your health care provider may recommend applying ice during the first 24-48 hours after your pain starts. To do this: ? Put ice in a plastic bag. ? Place a towel between your skin and the bag. ? Leave the ice on for 20 minutes, 2-3 times a day.  If directed, apply heat to the affected area as often as told by your health care provider. Use the heat source that your health care provider recommends, such as a moist heat pack or a heating pad. ? Place a towel between your skin and the heat source. ? Leave the heat on for 20-30 minutes. ? Remove the heat if your skin turns bright red. This is especially important if you are unable to feel pain, heat, or cold. You have a greater risk of getting burned. Activity    Do not stay in bed.  Staying in bed for more than 1-2 days can delay your recovery.  Sit up and stand up straight. Avoid leaning forward when you sit, or hunching over when you stand. ? If you work at a desk, sit close to it so you do not need to lean over. Keep your chin tucked in. Keep your neck drawn back, and keep your elbows bent at a right angle. Your arms should look like the letter "L." ? Sit high and close to the steering wheel when you drive. Add lower back (lumbar) support to your car seat, if needed.  Take short walks on even surfaces as soon as you are able. Try to increase the length of time you walk each day.  Do not sit, drive, or stand in one place for more than 30 minutes at a time. Sitting or standing for long periods of time can put stress on your back.  Do not drive or use heavy machinery while taking prescription pain medicine.  Use proper lifting techniques. When you bend and lift, use positions that put less stress on your back: ? [place] your knees. ? Keep the load close to your body. ? Avoid twisting.  Exercise regularly as told by your health care provider. Exercising helps your back heal faster and helps prevent back injuries by keeping muscles strong and flexible.  Work with a physical therapist to make a safe exercise program, as recommended by your  health care provider. Do any exercises as told by your physical therapist. Lifestyle  Maintain a healthy weight. Extra weight puts stress on your back and makes it difficult to have good posture.  Avoid activities or situations that make you feel anxious or stressed. Stress and anxiety increase muscle tension and can make back pain worse. Learn ways to manage anxiety and stress, such as through exercise. General instructions  Sleep on a firm mattress in a comfortable position. Try lying on your side with your knees slightly bent. If you lie on your back, put a pillow under your knees.  Follow your treatment plan as told by your health  care provider. This may include: ? Cognitive or behavioral therapy. ? Acupuncture or massage therapy. ? Meditation or yoga. Contact a health care provider if:  You have pain that is not relieved with rest or medicine.  You have increasing pain going down into your legs or buttocks.  Your pain does not improve after 2 weeks.  You have pain at night.  You lose weight without trying.  You have a fever or chills. Get help right away if:  You develop new bowel or bladder control problems.  You have unusual weakness or numbness in your arms or legs.  You develop nausea or vomiting.  You develop abdominal pain.  You feel faint. Summary  Acute back pain is sudden and usually short-lived.  Use proper lifting techniques. When you bend and lift, use positions that put less stress on your back.  Take over-the-counter and prescription medicines and apply heat or ice as directed by your health care provider. This information is not intended to replace advice given to you by your health care provider. Make sure you discuss any questions you have with your health care provider. Document Released: 12/26/2004 Document Revised: 04/16/2018 Document Reviewed: 08/09/2016 Elsevier Patient Education  2020 [employer].

## 2018-09-19 NOTE — Progress Notes (Signed)
   Subjective:    Patient ID: Micheal Holland, male    DOB: Aug 03, 1960, 58 y.o.   MRN: [id number]  HPI Kailin is here today for chronic f/u with hx of HTN, impaired fasting glucose, hyperlipidemia, new findings of proteinuria and vitamin d deficiency. He reports taking meds as directed and tolerating. Does report he took 12 weeks of vitamin d. Has no concerns today except requesting RF on muscle relaxer he was given in ED as he reports it helps and he continues to have back pain although it's better. Blood pressure continues to remain elevated and patient refuses medication despite discussed risk factors.  Interim noted went to ED 08/2018 for back pain and given NSAIDS and muscle relaxers. Discussed against taking NSAIDS if he can avoid it because he refuses to increase or adjust b/p meds for better control and urine now has protein.  Labs that he was supposed to get 07/2018 will be drawn and will call pt with results when they return; verbalizes understanding  Refuses influenza, but agrees to Tdap  Review of Systems  Constitutional: Negative for fever.  Eyes: Negative for visual disturbance.  Respiratory: Negative for cough and shortness of breath.   Cardiovascular: Negative for chest pain.  Musculoskeletal: Positive for back pain. Negative for gait problem.  Neurological: Negative for dizziness.       Objective:   Physical Exam Vitals signs reviewed.  Constitutional:      General: He is not in acute distress.    Appearance: Normal appearance. He is well-developed. He is not ill-appearing.  HENT:     Head: Normocephalic and atraumatic.  Neck:     Musculoskeletal: Normal range of motion and neck supple.     Vascular: No carotid bruit.  Cardiovascular:     Rate and Rhythm: Normal rate and regular rhythm.     Heart sounds: Normal heart sounds.     Comments: Radials 2+ Pulmonary:     Effort: Pulmonary effort is normal. No respiratory distress.     Breath sounds: Normal breath  sounds.  Abdominal:     General: Bowel sounds are normal.     Palpations: Abdomen is soft.     Tenderness: There is no abdominal tenderness.  Musculoskeletal: Normal range of motion.        General: No deformity. Tenderness: left lower l/s region.  Skin:    General: Skin is warm and dry.  Neurological:     General: No focal deficit present.     Mental Status: He is alert and oriented to person, place, and time.  Psychiatric:        Mood and Affect: Mood normal.           Assessment & Plan:

## 2018-09-20 ENCOUNTER — Other Ambulatory Visit: Payer: Self-pay | Admitting: Nurse Practitioner

## 2018-09-20 DIAGNOSIS — E559 Vitamin D deficiency, unspecified: Secondary | ICD-10-CM

## 2018-09-20 LAB — SPECIMEN STATUS REPORT

## 2018-09-20 MED ORDER — VITAMIN D (ERGOCALCIFEROL) 1.25 MG (50000 UNIT) PO CAPS: ORAL_CAPSULE | ORAL | 0 refills | Status: DC

## 2018-09-23 LAB — CMP12+LP+7AC+CBC/PLT+HB A1C
ALT: 25 IU/L (ref 0–44)
AST: 25 IU/L (ref 0–40)
Albumin/Globulin Ratio: 2.1 (ref 1.2–2.2)
Albumin: 4.7 g/dL (ref 3.8–4.9)
Alkaline Phosphatase: 61 IU/L (ref 39–117)
BUN/Creatinine Ratio: 14 (ref 9–20)
BUN: 15 mg/dL (ref 6–24)
Bilirubin Total: 0.4 mg/dL (ref 0.0–1.2)
Bilirubin, Direct: 0.14 mg/dL (ref 0.00–0.40)
Calcium: 9.7 mg/dL (ref 8.7–10.2)
Chloride: 105 mmol/L (ref 96–106)
Chol/HDL Ratio: 3.1 ratio (ref 0.0–5.0)
Cholesterol, Total: 170 mg/dL (ref 100–199)
Creatinine, Ser: 1.06 mg/dL (ref 0.76–1.27)
GFR calc Af Amer: 90 mL/min/{1.73_m2} (ref >59)
GFR calc non Af Amer: 78 mL/min/{1.73_m2} (ref >59)
GGT: 50 IU/L (ref 0–65)
Globulin, Total: 2.2 g/dL (ref 1.5–4.5)
Glucose: 102 mg/dL — ABNORMAL HIGH (ref 65–99)
HDL: 54 mg/dL (ref >39)
Hematocrit: 40.4 % (ref 37.5–51.0)
Hemoglobin: 13.8 g/dL (ref 13.0–17.7)
Hgb A1c MFr Bld: 6 % — ABNORMAL HIGH (ref 4.8–5.6)
Iron: 59 ug/dL (ref 38–169)
LDH: 182 IU/L (ref 121–224)
LDL Chol Calc (NIH): 72 mg/dL (ref 0–99)
MCH: 29.7 pg (ref 26.6–33.0)
MCHC: 34.2 g/dL (ref 31.5–35.7)
MCV: 87 fL (ref 79–97)
Phosphorus: 4 mg/dL (ref 2.8–4.1)
Platelets: 303 10*3/uL (ref 150–450)
Potassium: 4.6 mmol/L (ref 3.5–5.2)
RBC: 4.64 x10E6/uL (ref 4.14–5.80)
RDW: 13.4 % (ref 11.6–15.4)
Sodium: 139 mmol/L (ref 134–144)
Total Protein: 6.9 g/dL (ref 6.0–8.5)
Triglycerides: 271 mg/dL — ABNORMAL HIGH (ref 0–149)
Uric Acid: 5.1 mg/dL (ref 3.7–8.6)
VLDL Cholesterol Cal: 44 mg/dL — ABNORMAL HIGH (ref 5–40)
WBC: 6.2 10*3/uL (ref 3.4–10.8)

## 2018-09-23 LAB — VITAMIN D 25 HYDROXY (VIT D DEFICIENCY, FRACTURES): Vit D, 25-Hydroxy: 21.5 ng/mL — ABNORMAL LOW (ref 30.0–100.0)

## 2018-09-23 LAB — MICROALBUMIN, URINE: Microalbumin, Urine: 16.7 ug/mL

## 2018-10-17 ENCOUNTER — Other Ambulatory Visit: Payer: Self-pay

## 2018-10-24 ENCOUNTER — Ambulatory Visit: Payer: Self-pay | Admitting: Nurse Practitioner

## 2018-11-27 ENCOUNTER — Ambulatory Visit: Payer: Self-pay | Admitting: *Deleted

## 2018-11-27 ENCOUNTER — Other Ambulatory Visit: Payer: Self-pay

## 2018-11-27 DIAGNOSIS — Z23 Encounter for immunization: Secondary | ICD-10-CM

## 2018-12-19 ENCOUNTER — Other Ambulatory Visit: Payer: Self-pay

## 2018-12-19 ENCOUNTER — Ambulatory Visit: Payer: Self-pay | Admitting: Nurse Practitioner

## 2018-12-19 ENCOUNTER — Encounter: Payer: Self-pay | Admitting: Nurse Practitioner

## 2018-12-19 VITALS — BP 148/92 | HR 86 | Resp 20 | Ht 74.0 in | Wt 239.0 lb

## 2018-12-19 DIAGNOSIS — I10 Essential (primary) hypertension: Secondary | ICD-10-CM

## 2018-12-19 DIAGNOSIS — Z0000 Encounter for general adult medical examination without abnormal findings: Secondary | ICD-10-CM

## 2018-12-19 DIAGNOSIS — E559 Vitamin D deficiency, unspecified: Secondary | ICD-10-CM

## 2018-12-19 DIAGNOSIS — Z23 Encounter for immunization: Secondary | ICD-10-CM

## 2018-12-19 DIAGNOSIS — R2990 Unspecified symptoms and signs involving the nervous system: Secondary | ICD-10-CM

## 2018-12-19 LAB — HEMOCCULT GUIAC POC 1CARD (OFFICE): Fecal Occult Blood, POC: NEGATIVE

## 2018-12-19 MED ORDER — SHINGRIX 50 MCG/0.5ML IM SUSR: 0.5000 mL | Freq: Once | INTRAMUSCULAR | 0 refills | Status: AC

## 2018-12-19 MED ORDER — LOSARTAN POTASSIUM 50 MG PO TABS: 50.0000 mg | ORAL_TABLET | Freq: Every day | ORAL | 1 refills | Status: DC

## 2018-12-19 NOTE — Progress Notes (Signed)
Subjective:    Patient ID: Micheal Holland, male    DOB: 01-29-1960, 58 y.o.   MRN: [id number]  HPI Micheal Holland is here today for his years CPE. He has no concerns to day except ongoing c/o back pain which today he reports left leg numbness and pain of the left when he walks on hard floors. He reports his back pain has been for the last year and the numbness for the last 5 months. He has a very strenuous job where he does a lot of bending and walking. He's been advised to do heat in the past, salonpas patches, stretching and reports he's done all of this. He has had an ED visit this year in which they put him on Baclofen and it helped his pain; which I did a courtesy RF as it was helpful. All ROS unremarkable except MSK.  Followed by cardiology with last visit 01/2017 due to [place]. Also has HTN and hyperlipidemia. Has been out of Losartan x 1 month as he reports he was advised to call pharmacy, then provider, then pharmacy and he just decided not to pursue the refill any further.  Reports last eye exam 1 year ago Hasn't seen dentist in 304 years. I don't have any problems, I clean my teeth with baking soda. UTD on flu and Tdap. Agrees to Shingrix  He denies smoking, PHQ2: negative and GAD7: negative  Review of Systems  Constitutional: Negative for fatigue and fever.  HENT: Negative.   Eyes: Negative.   Respiratory: Negative.   Cardiovascular: Negative.  Negative for chest pain.  Gastrointestinal: Negative.  Negative for abdominal pain, blood in stool, constipation, diarrhea and nausea.  Genitourinary: Negative.   Musculoskeletal: Positive for back pain.  Skin: Negative.   Neurological: Positive for numbness.       Left leg  Psychiatric/Behavioral: Negative.        Objective:   Physical Exam Constitutional:      General: He is not in acute distress.    Appearance: Normal appearance. He is not ill-appearing.  HENT:     Head: Normocephalic and atraumatic.     Right Ear: There is impacted  cerumen.     Left Ear: Tympanic membrane normal. There is no impacted cerumen.     Nose: Nose normal.     Mouth/Throat:     Mouth: Mucous membranes are dry.     Pharynx: Oropharynx is clear.  Eyes:     General:        Right eye: No discharge.        Left eye: No discharge.     Extraocular Movements: Extraocular movements intact.  Neck:     Vascular: No carotid bruit.  Cardiovascular:     Rate and Rhythm: Normal rate and regular rhythm.     Pulses: Normal pulses.     Heart sounds: Normal heart sounds.     Comments: No lower strength edema Pulmonary:     Effort: Pulmonary effort is normal. No respiratory distress.     Breath sounds: Normal breath sounds. No wheezing.  Abdominal:     General: Bowel sounds are normal. There is no distension.     Palpations: Abdomen is soft. There is no mass.     Tenderness: There is no abdominal tenderness. There is no right CVA tenderness or left CVA tenderness.     Hernia: No hernia is present.  Genitourinary:    Rectum: Guaiac result negative.  Musculoskeletal:  General: No swelling, tenderness, deformity or signs of injury.     Cervical back: Normal range of motion and neck supple. No tenderness.     Right lower leg: No edema.     Left lower leg: No edema.     Comments: Generalized weakness to upper and lower extremities. Arm strength 3/5 bilaterally. Hand grip 3/5. Unable to do straight leg raise off table with both legs, significan weakness noted. Limited ROM with rotation and hyperextension of the spine. Good ROM with forward flexion and lateral bend. "Noted discomfort when lying flat and reports straight leg raise during exam illicited pain".\ No tenderness to back  Lymphadenopathy:     Cervical: No cervical adenopathy.  Skin:    General: Skin is warm and dry.     Capillary Refill: Capillary refill takes less than 2 seconds.  Neurological:     Mental Status: He is alert and oriented to person, place, and time.     Sensory: No  sensory deficit.     Motor: Weakness present.     Coordination: Coordination abnormal.     Comments: Abnormal lap slap, normal heel to shin. Abnormal heel to toe walk.Unable to get accurate rhomberg because he keeps opening eyes  No illicited reflex from patella on left and minimal 1+ to right  Psychiatric:        Mood and Affect: Mood normal.        Behavior: Behavior normal.           Assessment & Plan:  Referring to neuro for abnorma neuro exam and left leg paresthesia with radiculopathy

## 2018-12-19 NOTE — Patient Instructions (Addendum)
Micheal Holland, I will be referring you to neurology for your hx of back pain and abnormal neurology exam I will send your blood pressure medicine to the pharmacy and please resume as directed The order for your Shingrix vaccine has been sent to get at the pharmacy I will call you about your lab Please see a dentist, it's important to go every 6 months Return to clinic 4 months for chronic follow up

## 2018-12-20 ENCOUNTER — Encounter: Payer: Self-pay | Admitting: Neurology

## 2018-12-20 ENCOUNTER — Telehealth: Payer: Self-pay

## 2018-12-20 LAB — VITAMIN D 25 HYDROXY (VIT D DEFICIENCY, FRACTURES): Vit D, 25-Hydroxy: 21.3 ng/mL — ABNORMAL LOW (ref 30.0–100.0)

## 2018-12-20 NOTE — Telephone Encounter (Signed)
Contacted patient to make him aware of his Low Vitamin D Level. He is to pick up Vitamin D 5000 IU OTC and start taking daily. Left patient a message via voicemail.

## 2019-02-07 NOTE — Progress Notes (Signed)
NEUROLOGY CONSULTATION NOTE  Micheal Holland MRN: [id number] DOB: 1961/01/02  Referring provider: Maury Dus, NP Primary care provider: Maury Dus, NP  Reason for consult:  Abnormal neurologic exam  HISTORY OF PRESENT ILLNESS: Micheal Holland. [name] is a 59 year old left-handed male who presents for weakness and pain.  History supplemented by referring provider's notes.  Radiating down left leg.  Walking makes it worse. Started for a year.    History of back pain.  Picking up trunk stump.   Sometimes right leg hurt.  Sit down too long and then gets back  Sharp pain    He has a history of intermittent low back pain.  About a year ago, he developed acute onset of bilateral sided low back pain while trying to pull a tree stump out of the ground.  Pain persisted and gradually progressed for the next several months.  By the summer, he started experiencing severe sharp pain with associated numbness radiating down the left lateral proximal leg and posterior left lower leg whenever he was standing or on walking.  Pain is particularly severe when walking over hard floors.  Subsequently, he felt unsteadiness in his right leg when he would be standing or walking.  However, no significant lower extremity weakness.  After prolonged sitting, he would experience severe non-radiating bilateral low back pain.  No bowel or bladder dysfunction.  No neck pain or upper extremity symptoms.  He presented to the ED at [facility] in August where lumbar spine X-ray personally reviewed showed no acute or degenerative changes.  He failed pain relievers and muscle relaxants, such as naproxen and Norflex.  He has not had physical therapy due to concerns regarding exposure to Covid-19.  PAST MEDICAL HISTORY: Past Medical History:  Diagnosis Date  . Allergy   . Hyperlipidemia   . Hypertension 12/26/2017    PAST SURGICAL HISTORY: Past Surgical History:  Procedure Laterality Date   . COLONOSCOPY WITH PROPOFOL N/A 04/03/2016   Procedure: COLONOSCOPY WITH PROPOFOL;  Surgeon: Lollie Sails, MD;  Location: [facility] ENDOSCOPY;  Service: Endoscopy;  Laterality: N/A;  . none      MEDICATIONS: Current Outpatient Medications on File Prior to Visit  Medication Sig Dispense Refill  . aspirin EC 81 MG tablet Take 1 tablet (81 mg total) by mouth daily. 90 tablet 3  . ibuprofen (ADVIL,MOTRIN) 100 MG chewable tablet Chew by mouth every 8 (eight) hours as needed.    Marland Kitchen losartan (COZAAR) 50 MG tablet Take 1 tablet (50 mg total) by mouth daily. 90 tablet 1  . naproxen (NAPROSYN) 500 MG tablet Take 1 tablet (500 mg total) by mouth 2 (two) times daily with a meal. 20 tablet 00  . orphenadrine (NORFLEX) 100 MG tablet 1 tab PO every HS PRN for musculoskeletal pain (Patient not taking: Reported on 12/19/2018) 14 tablet 0  . rosuvastatin (CRESTOR) 40 MG tablet Take 1 tablet (40 mg total) by mouth daily. 90 tablet 0  . sildenafil (REVATIO) 20 MG tablet Take 1 tablet (20 mg total) by mouth as needed. Take 1-5 tabs as needed prior to intercourse 30 tablet 11  . Vitamin D, Ergocalciferol, (DRISDOL) 1.25 MG (50000 UT) CAPS capsule 1 capsule PO once a week x 12 weeks (Patient not taking: Reported on 12/19/2018) 12 capsule 0   No current facility-administered medications on file prior to visit.    ALLERGIES: Allergies  Allergen Reactions  . Other Other (See Comments)    Fresh fruit - makes  throat itch, canned does not    FAMILY HISTORY: Family History  Problem Relation Age of Onset  . Hypertension Mother        heart disease died 37   . Heart disease Mother   . Heart attack Mother   . Heart disease Father        "hole  behind heart32 years old died from this "  . Hypertension Father   . Heart attack Father   . Kidney disease Sister        one sister has had transplant kidney / one sister died with tumor was J. witness did not do surgery  . Heart Problems Sister   . Heart disease  Brother        heart disease "hole behind heart"  . Heart attack Brother     SOCIAL HISTORY: Social History   Socioeconomic History  . Marital status: Single    Spouse name: Not on file  . Number of children: Not on file  . Years of education: Not on file  . Highest education level: Not on file  Occupational History  . Not on file  Tobacco Use  . Smoking status: Never Smoker  . Smokeless tobacco: Never Used  Substance and Sexual Activity  . Alcohol use: Yes    Alcohol/week: 4.0 standard drinks    Types: 4 Cans of beer per week    Comment: Beer daily 2- 40 oz  . Drug use: No  . Sexual activity: Yes    Birth control/protection: None  Other Topics Concern  . Not on file  Social History Narrative  . Not on file   Social Determinants of Health   Financial Resource Strain:   . Difficulty of Paying Living Expenses: Not on file  Food Insecurity:   . Worried About Charity fundraiser in the Last Year: Not on file  . Ran Out of Food in the Last Year: Not on file  Transportation Needs:   . Lack of Transportation (Medical): Not on file  . Lack of Transportation (Non-Medical): Not on file  Physical Activity:   . Days of Exercise per Week: Not on file  . Minutes of Exercise per Session: Not on file  Stress:   . Feeling of Stress : Not on file  Social Connections:   . Frequency of Communication with Friends and Family: Not on file  . Frequency of Social Gatherings with Friends and Family: Not on file  . Attends Religious Services: Not on file  . Active Member of Clubs or Organizations: Not on file  . Attends Archivist Meetings: Not on file  . Marital Status: Not on file  Intimate Partner Violence:   . Fear of Current or Ex-Partner: Not on file  . Emotionally Abused: Not on file  . Physically Abused: Not on file  . Sexually Abused: Not on file    REVIEW OF SYSTEMS: Constitutional: No fevers, chills, or sweats, no generalized fatigue, change in appetite Eyes:  No visual changes, double vision, eye pain Ear, nose and throat: No hearing loss, ear pain, nasal congestion, sore throat Cardiovascular: No chest pain, palpitations Respiratory:  No shortness of breath at rest or with exertion, wheezes GastrointestinaI: No nausea, vomiting, diarrhea, abdominal pain, fecal incontinence Genitourinary:  No dysuria, urinary retention or frequency Musculoskeletal:  No neck pain, back pain Integumentary: No rash, pruritus, skin lesions Neurological: as above Psychiatric: No depression, insomnia, anxiety Endocrine: No palpitations, fatigue, diaphoresis, mood swings, change in appetite, change  in weight, increased thirst Hematologic/Lymphatic:  No purpura, petechiae. Allergic/Immunologic: no itchy/runny eyes, nasal congestion, recent allergic reactions, rashes  PHYSICAL EXAM: Blood pressure (!) 182/98, pulse (!) 108, height 6' 2.5" (1.892 m), weight 240 lb (108.9 kg), SpO2 98 %. General: No acute distress.  Patient appears well-groomed.  Head:  Normocephalic/atraumatic Eyes:  fundi examined but not visualized Neck: supple, no paraspinal tenderness, full range of motion Back: No paraspinal tenderness Heart: regular rate and rhythm Lungs: Clear to auscultation bilaterally. Vascular: No carotid bruits. Neurological Exam: Mental status: alert and oriented to person, place, and time, recent and remote memory intact, fund of knowledge intact, attention and concentration intact, speech fluent and not dysarthric, language intact. Cranial nerves: CN I: not tested CN II: pupils equal, round and reactive to light, visual fields intact CN III, IV, VI:  full range of motion, no nystagmus, no ptosis CN V: facial sensation intact CN VII: upper and lower face symmetric CN VIII: hearing intact CN IX, X: gag intact, uvula midline CN XI: sternocleidomastoid and trapezius muscles intact CN XII: tongue midline Bulk & Tone: normal, no fasciculations. Motor:  4+/5 right  extensor hallucis longus.  Otherwise, 5/5 throughout Sensation:  Pinprick and vibration sensation intact. Deep Tendon Reflexes:  2+ throughout, toes downgoing.  Finger to nose testing:  Without dysmetria.  Heel to shin:  Without dysmetria.  Gait:  Normal station and stride.  Able to turn and tandem walk. Romberg negative.  IMPRESSION: 1.  Bilateral low back pain with left radicular pain.  As he has had ongoing pain for a year despite pharmacologic therapy, and he seems to exhibit some weakness in the right extensor hallucis longus muscle, I think MRI is appropriate. 2.  Elevated blood pressure.  PLAN: 1.  MRI of lumbar spine without contrast 2.  Further recommendations pending results. 3.  Blood pressure follow up with PCP  Thank you for allowing me to take part in the care of this patient.  Metta Clines, DO  CC:  Micheal Dus, NP

## 2019-02-10 ENCOUNTER — Ambulatory Visit: Payer: BC Managed Care – PPO | Admitting: Neurology

## 2019-02-10 ENCOUNTER — Other Ambulatory Visit: Payer: Self-pay

## 2019-02-10 ENCOUNTER — Encounter: Payer: Self-pay | Admitting: Neurology

## 2019-02-10 VITALS — BP 182/98 | HR 108 | Ht 74.5 in | Wt 240.0 lb

## 2019-02-10 DIAGNOSIS — I10 Essential (primary) hypertension: Secondary | ICD-10-CM | POA: Diagnosis not present

## 2019-02-10 DIAGNOSIS — M5442 Lumbago with sciatica, left side: Secondary | ICD-10-CM

## 2019-02-10 DIAGNOSIS — R29898 Other symptoms and signs involving the musculoskeletal system: Secondary | ICD-10-CM | POA: Diagnosis not present

## 2019-02-10 DIAGNOSIS — G8929 Other chronic pain: Secondary | ICD-10-CM | POA: Diagnosis not present

## 2019-02-10 NOTE — Patient Instructions (Addendum)
We will get MRI of lumbar spine without contrast Further recommendations pending results. We have sent a referral to [facility] for your MRI and they will call you directly to schedule your appointment. They are located at [place]. If you need to contact them directly please call [phone].

## 2019-02-21 ENCOUNTER — Ambulatory Visit
Admission: RE | Admit: 2019-02-21 | Discharge: 2019-02-21 | Disposition: A | Discharge status: Home / Self Care | Payer: BC Managed Care – PPO | Source: Ambulatory Visit | Attending: Neurology | Admitting: Neurology

## 2019-02-21 ENCOUNTER — Other Ambulatory Visit: Payer: Self-pay

## 2019-02-21 DIAGNOSIS — G8929 Other chronic pain: Secondary | ICD-10-CM

## 2019-02-21 DIAGNOSIS — M48061 Spinal stenosis, lumbar region without neurogenic claudication: Secondary | ICD-10-CM | POA: Diagnosis not present

## 2019-02-21 DIAGNOSIS — R29898 Other symptoms and signs involving the musculoskeletal system: Secondary | ICD-10-CM

## 2019-02-27 ENCOUNTER — Ambulatory Visit: Payer: Self-pay | Admitting: Nurse Practitioner

## 2019-03-04 DIAGNOSIS — M542 Cervicalgia: Secondary | ICD-10-CM | POA: Diagnosis not present

## 2019-03-04 DIAGNOSIS — M5126 Other intervertebral disc displacement, lumbar region: Secondary | ICD-10-CM | POA: Diagnosis not present

## 2019-03-04 DIAGNOSIS — M48062 Spinal stenosis, lumbar region with neurogenic claudication: Secondary | ICD-10-CM | POA: Diagnosis not present

## 2019-03-06 ENCOUNTER — Other Ambulatory Visit: Payer: Self-pay

## 2019-03-06 ENCOUNTER — Encounter: Payer: Self-pay | Admitting: Nurse Practitioner

## 2019-03-06 ENCOUNTER — Ambulatory Visit: Payer: Self-pay | Admitting: Nurse Practitioner

## 2019-03-06 ENCOUNTER — Telehealth: Payer: Self-pay | Admitting: Nurse Practitioner

## 2019-03-06 VITALS — BP 122/88 | HR 85 | Temp 98.7°F | Resp 20 | Wt 238.0 lb

## 2019-03-06 DIAGNOSIS — E785 Hyperlipidemia, unspecified: Secondary | ICD-10-CM

## 2019-03-06 DIAGNOSIS — E559 Vitamin D deficiency, unspecified: Secondary | ICD-10-CM

## 2019-03-06 DIAGNOSIS — I10 Essential (primary) hypertension: Secondary | ICD-10-CM

## 2019-03-06 MED ORDER — ROSUVASTATIN CALCIUM 40 MG PO TABS: 40.0000 mg | ORAL_TABLET | Freq: Every day | ORAL | 3 refills | Status: DC

## 2019-03-06 MED ORDER — AMLODIPINE BESYLATE 5 MG PO TABS: 5.0000 mg | ORAL_TABLET | Freq: Every day | ORAL | 1 refills | Status: DC

## 2019-03-06 NOTE — Progress Notes (Signed)
   Subjective:    Patient ID: Micheal Holland, male    DOB: [birth year], 59 y.o.   MRN: [id number]  HPI Micheal Holland is here today to discuss blood pressure reading from consult notes. He has a long hx of HTN and in the past has refused to increase or add any medications for his blood pressure despite discussed risks heart attack/stroke d/t ED SE which he's on Cialis for. Today a long discussion about the need to better control his HTN and the risk associated with uncontrolled HTN to also include kidney damage and he verbalizes understanding and agrees to additional treatment. I also expressed that treatment for his spine can be impeded if he did not consider other options for better management of HTN as he would be at more of a risk and if he ever needed surgery that would impede or delay treatment and he understands the need to adjust his medication for HTN. He denies any blurred vision, dizziness, SOB, or chest pain. Negative Covid screening.  He reports he takes meds as directed except hasn't taken cholesterol meds since he's run out because "I didn't have anymore refills" and reports he didn't know he was to continue. He gives me an update on referrals done and he is to start steroid injections with chiropractor services and will be getting an MRI to his cervical spine 03/12/19. Micheal Holland has no concerns today and feels he's doing good and would be much better if he could get rid of his neck and back pain which he's now followed by specialist for and to soon start treatment.  Medications reconciliaton/review done today. Reports takes med for vitamin d deficiency, asa for heart health, losartan for HTN and hasn't taken med for hyperlipidemia in months.  Review of Systems  Eyes: Negative for visual disturbance.  Respiratory: Negative for shortness of breath.   Cardiovascular: Negative for chest pain.  Neurological: Negative for dizziness and headaches.       Objective:   Physical Exam Constitutional:      Appearance: Normal appearance.  HENT:     Head: Normocephalic and atraumatic.  Neck:     Vascular: No carotid bruit.     Comments: Limited ROM of neck with rotation Cardiovascular:     Rate and Rhythm: Normal rate and regular rhythm.     Pulses: Normal pulses.     Heart sounds: Normal heart sounds.     Comments: Bilateral radials/posterior tibs 2+ Pulmonary:     Effort: Pulmonary effort is normal.     Breath sounds: Normal breath sounds.  Abdominal:     General: Bowel sounds are normal.     Tenderness: There is no abdominal tenderness. There is no guarding.  Musculoskeletal:        General: No swelling.     Comments: No lower ext. edema  Skin:    General: Skin is warm and dry.  Neurological:     Mental Status: He is alert and oriented to person, place, and time.  Psychiatric:        Mood and Affect: Mood normal.           Assessment & Plan:

## 2019-03-06 NOTE — Patient Instructions (Addendum)
I am adding Amlodipine to your regimen for your blood pressure. We have discussed side effects and risks of uncontrolled hypertension today. If you have any issues please call the office. Take all meds as directed. Remember it should be 4 prescriptons and 1 over the counter (baby aspirin) Return to the clinic in 1 month to follow up on hypertension and have labs done

## 2019-03-07 DIAGNOSIS — M5126 Other intervertebral disc displacement, lumbar region: Secondary | ICD-10-CM | POA: Diagnosis not present

## 2019-03-07 DIAGNOSIS — M48062 Spinal stenosis, lumbar region with neurogenic claudication: Secondary | ICD-10-CM | POA: Diagnosis not present

## 2019-03-11 DIAGNOSIS — M48062 Spinal stenosis, lumbar region with neurogenic claudication: Secondary | ICD-10-CM | POA: Diagnosis not present

## 2019-03-11 DIAGNOSIS — M5126 Other intervertebral disc displacement, lumbar region: Secondary | ICD-10-CM | POA: Diagnosis not present

## 2019-03-13 DIAGNOSIS — M48062 Spinal stenosis, lumbar region with neurogenic claudication: Secondary | ICD-10-CM | POA: Diagnosis not present

## 2019-03-13 DIAGNOSIS — M542 Cervicalgia: Secondary | ICD-10-CM | POA: Diagnosis not present

## 2019-03-13 DIAGNOSIS — M5126 Other intervertebral disc displacement, lumbar region: Secondary | ICD-10-CM | POA: Diagnosis not present

## 2019-03-19 DIAGNOSIS — M5126 Other intervertebral disc displacement, lumbar region: Secondary | ICD-10-CM | POA: Diagnosis not present

## 2019-03-19 DIAGNOSIS — M48062 Spinal stenosis, lumbar region with neurogenic claudication: Secondary | ICD-10-CM | POA: Diagnosis not present

## 2019-03-21 DIAGNOSIS — M5126 Other intervertebral disc displacement, lumbar region: Secondary | ICD-10-CM | POA: Diagnosis not present

## 2019-03-21 DIAGNOSIS — M48062 Spinal stenosis, lumbar region with neurogenic claudication: Secondary | ICD-10-CM | POA: Diagnosis not present

## 2019-03-25 DIAGNOSIS — M5126 Other intervertebral disc displacement, lumbar region: Secondary | ICD-10-CM | POA: Diagnosis not present

## 2019-03-25 DIAGNOSIS — M48062 Spinal stenosis, lumbar region with neurogenic claudication: Secondary | ICD-10-CM | POA: Diagnosis not present

## 2019-03-27 DIAGNOSIS — M542 Cervicalgia: Secondary | ICD-10-CM | POA: Diagnosis not present

## 2019-03-28 DIAGNOSIS — M5126 Other intervertebral disc displacement, lumbar region: Secondary | ICD-10-CM | POA: Diagnosis not present

## 2019-03-28 DIAGNOSIS — M48062 Spinal stenosis, lumbar region with neurogenic claudication: Secondary | ICD-10-CM | POA: Diagnosis not present

## 2019-04-01 ENCOUNTER — Other Ambulatory Visit: Payer: Self-pay

## 2019-04-01 ENCOUNTER — Other Ambulatory Visit: Payer: BC Managed Care – PPO

## 2019-04-01 DIAGNOSIS — E785 Hyperlipidemia, unspecified: Secondary | ICD-10-CM

## 2019-04-01 DIAGNOSIS — M48062 Spinal stenosis, lumbar region with neurogenic claudication: Secondary | ICD-10-CM | POA: Diagnosis not present

## 2019-04-01 DIAGNOSIS — I10 Essential (primary) hypertension: Secondary | ICD-10-CM

## 2019-04-01 DIAGNOSIS — M5126 Other intervertebral disc displacement, lumbar region: Secondary | ICD-10-CM | POA: Diagnosis not present

## 2019-04-01 DIAGNOSIS — E559 Vitamin D deficiency, unspecified: Secondary | ICD-10-CM

## 2019-04-02 LAB — CMP12+LP+7AC+CBC/PLT+HB A1C
ALT: 33 IU/L (ref 0–44)
AST: 33 IU/L (ref 0–40)
Albumin/Globulin Ratio: 2.2 (ref 1.2–2.2)
Albumin: 4.8 g/dL (ref 3.8–4.9)
Alkaline Phosphatase: 60 IU/L (ref 39–117)
BUN/Creatinine Ratio: 10 (ref 9–20)
BUN: 12 mg/dL (ref 6–24)
Bilirubin Total: 0.4 mg/dL (ref 0.0–1.2)
Bilirubin, Direct: 0.11 mg/dL (ref 0.00–0.40)
Calcium: 9.5 mg/dL (ref 8.7–10.2)
Chloride: 104 mmol/L (ref 96–106)
Chol/HDL Ratio: 3 ratio (ref 0.0–5.0)
Cholesterol, Total: 176 mg/dL (ref 100–199)
Creatinine, Ser: 1.15 mg/dL (ref 0.76–1.27)
GFR calc Af Amer: 82 mL/min/{1.73_m2} (ref >59)
GFR calc non Af Amer: 71 mL/min/{1.73_m2} (ref >59)
GGT: 71 IU/L — ABNORMAL HIGH (ref 0–65)
Globulin, Total: 2.2 g/dL (ref 1.5–4.5)
Glucose: 116 mg/dL — ABNORMAL HIGH (ref 65–99)
HDL: 58 mg/dL (ref >39)
Hematocrit: 42.2 % (ref 37.5–51.0)
Hemoglobin: 14.6 g/dL (ref 13.0–17.7)
Hgb A1c MFr Bld: 6.2 % — ABNORMAL HIGH (ref 4.8–5.6)
Iron: 74 ug/dL (ref 38–169)
LDH: 188 IU/L (ref 121–224)
LDL Chol Calc (NIH): 99 mg/dL (ref 0–99)
MCH: 30.5 pg (ref 26.6–33.0)
MCHC: 34.6 g/dL (ref 31.5–35.7)
MCV: 88 fL (ref 79–97)
Phosphorus: 3.8 mg/dL (ref 2.8–4.1)
Platelets: 306 10*3/uL (ref 150–450)
Potassium: 4.3 mmol/L (ref 3.5–5.2)
RBC: 4.78 x10E6/uL (ref 4.14–5.80)
RDW: 13.5 % (ref 11.6–15.4)
Sodium: 140 mmol/L (ref 134–144)
Total Protein: 7 g/dL (ref 6.0–8.5)
Triglycerides: 103 mg/dL (ref 0–149)
Uric Acid: 6 mg/dL (ref 3.8–8.4)
VLDL Cholesterol Cal: 19 mg/dL (ref 5–40)
WBC: 5.1 10*3/uL (ref 3.4–10.8)

## 2019-04-02 LAB — B12 AND FOLATE PANEL
Folate: 16.1 ng/mL (ref >3.0)
Vitamin B-12: 234 pg/mL (ref 232–1245)

## 2019-04-02 LAB — MICROALBUMIN, URINE: Microalbumin, Urine: 19.4 ug/mL

## 2019-04-02 LAB — VITAMIN D 25 HYDROXY (VIT D DEFICIENCY, FRACTURES): Vit D, 25-Hydroxy: 48.7 ng/mL (ref 30.0–100.0)

## 2019-04-03 DIAGNOSIS — M5126 Other intervertebral disc displacement, lumbar region: Secondary | ICD-10-CM | POA: Diagnosis not present

## 2019-04-03 DIAGNOSIS — M48062 Spinal stenosis, lumbar region with neurogenic claudication: Secondary | ICD-10-CM | POA: Diagnosis not present

## 2019-04-04 ENCOUNTER — Ambulatory Visit: Payer: Self-pay | Admitting: Nurse Practitioner

## 2019-04-10 ENCOUNTER — Ambulatory Visit: Payer: BC Managed Care – PPO | Admitting: Nurse Practitioner

## 2019-04-10 ENCOUNTER — Encounter: Payer: Self-pay | Admitting: Nurse Practitioner

## 2019-04-10 ENCOUNTER — Other Ambulatory Visit: Payer: Self-pay

## 2019-04-10 VITALS — BP 132/88 | HR 89 | Temp 98.4°F | Resp 18 | Ht 74.0 in | Wt 241.0 lb

## 2019-04-10 DIAGNOSIS — I10 Essential (primary) hypertension: Secondary | ICD-10-CM

## 2019-04-10 DIAGNOSIS — E559 Vitamin D deficiency, unspecified: Secondary | ICD-10-CM

## 2019-04-10 DIAGNOSIS — E785 Hyperlipidemia, unspecified: Secondary | ICD-10-CM

## 2019-04-10 DIAGNOSIS — R7303 Prediabetes: Secondary | ICD-10-CM

## 2019-04-10 NOTE — Progress Notes (Addendum)
   Subjective:    Patient ID: Micheal Holland, male    DOB: March 20, 1960, 59 y.o.   MRN: [id number]  HPI Rogerio is here today for f/u on HTN, vitamin d deficiency, prediabetes and hyperlipidemia. He is taking his meds as directed and tolerating. He has no concerns today and no refill request. He endorses he's now taking OTC vitamin d 5000 iu daily.   He is now followed by ortho in which he was sent for his back and he went to chiropractor and denies any pain today. He is now being followed for neck/shoulder which he rates his pain 6/7 and is to start chiropractor 4/5 for that.  Last ordered labs were reviewed and discussed with [facility] today.  Review of Systems  Constitutional: Negative for fever.       Covid screening negative  Respiratory: Negative for shortness of breath and wheezing.   Cardiovascular: Negative for chest pain.  Musculoskeletal: Positive for neck pain. Negative for back pain.  Skin: Negative for rash.       Objective:   Physical Exam Constitutional:      General: He is not in acute distress.    Appearance: Normal appearance. He is not ill-appearing.  HENT:     Head: Normocephalic and atraumatic.  Cardiovascular:     Rate and Rhythm: Normal rate.     Heart sounds: Normal heart sounds. No murmur. No friction rub.  Pulmonary:     Effort: Pulmonary effort is normal.     Breath sounds: Normal breath sounds.  Abdominal:     General: Bowel sounds are normal.  Skin:    General: Skin is warm and dry.  Neurological:     General: No focal deficit present.     Mental Status: He is alert and oriented to person, place, and time.  Psychiatric:        Mood and Affect: Mood normal.        Behavior: Behavior normal.           Assessment & Plan:

## 2019-04-10 NOTE — Patient Instructions (Addendum)
Micheal Holland, you labs are stable, but please work on modifying or reducing your carbohydrate intake to prevent diabetes When you complete your current vitamin d bottle; get a 1 a day men's multivitamin Continue services with ortho, it appears you are improving and I hope chiropractor is able to help your neck and shoulders Return to the clinic in 6 months for your physical with labs and as needed  Prediabetes Prediabetes is the condition of having a blood sugar (blood glucose) level that is higher than it should be, but not high enough for you to be diagnosed with type 2 diabetes. Having prediabetes puts you at risk for developing type 2 diabetes (type 2 diabetes mellitus). Prediabetes may be called impaired glucose tolerance or impaired fasting glucose. Prediabetes usually does not cause symptoms. Your health care provider can diagnose this condition with blood tests. You may be tested for prediabetes if you are overweight and if you have at least one other risk factor for prediabetes. What is blood glucose, and how is it measured? Blood glucose refers to the amount of glucose in your bloodstream. Glucose comes from eating foods that contain sugars and starches (carbohydrates), which the body breaks down into glucose. Your blood glucose level may be measured in mg/dL (milligrams per deciliter) or mmol/L (millimoles per liter). Your blood glucose may be checked with one or more of the following blood tests:  A fasting blood glucose (FBG) test. You will not be allowed to eat (you will fast) for 8 hours or longer before a blood sample is taken. ? A normal range for FBG is 70-100 mg/dl (3.9-5.6 mmol/L).  An A1c (hemoglobin A1c) blood test. This test provides information about blood glucose control over the previous 2?26months.  An oral glucose tolerance test (OGTT). This test measures your blood glucose at two times: ? After fasting. This is your baseline level. ? Two hours after you drink a beverage that  contains glucose. You may be diagnosed with prediabetes:  If your FBG is 100?125 mg/dL (5.6-6.9 mmol/L).  If your A1c level is 5.7?6.4%.  If your OGTT result is 140?199 mg/dL (7.8-11 mmol/L). These blood tests may be repeated to confirm your diagnosis. How can this condition affect me? The pancreas produces a hormone (insulin) that helps to move glucose from the bloodstream into cells. When cells in the body do not respond properly to insulin that the body makes (insulin resistance), excess glucose builds up in the blood instead of going into cells. As a result, high blood glucose (hyperglycemia) can develop, which can cause many complications. Hyperglycemia is a symptom of prediabetes. Having high blood glucose for a long time is dangerous. Too much glucose in your blood can damage your nerves and blood vessels. Long-term damage can lead to complications from diabetes, which may include:  Heart disease.  Stroke.  Blindness.  Kidney disease.  Depression.  Poor circulation in the feet and legs, which could lead to surgical removal (amputation) in severe cases. What can increase my risk? Risk factors for prediabetes include:  Having a family member with type 2 diabetes.  Being overweight or obese.  Being older than age 85.  Being of American [place], African-American, Hispanic/Latino, or Asian/Pacific Islander descent.  Having an inactive (sedentary) lifestyle.  Having a history of heart disease.  History of gestational diabetes or polycystic ovary syndrome (PCOS), in women.  Having low levels of good cholesterol (HDL-C) or high levels of blood fats (triglycerides).  Having high blood pressure. What actions can I take  to prevent diabetes?       Be physically active. ? Do moderate-intensity physical activity for 30 or more minutes on 5 or more days of the week, or as much as told by your health care provider. This could be brisk walking, biking, or water  aerobics. ? Ask your health care provider what activities are safe for you. A mix of physical activities may be best, such as walking, swimming, cycling, and strength training.  Lose weight as told by your health care provider. ? Losing 5-7% of your body weight can reverse insulin resistance. ? Your health care provider can determine how much weight loss is best for you and can help you lose weight safely.  Follow a healthy meal plan. This includes eating lean proteins, complex carbohydrates, fresh fruits and vegetables, low-fat dairy products, and healthy fats. ? Follow instructions from your health care provider about eating or drinking restrictions. ? Make an appointment to see a diet and nutrition specialist (registered dietitian) to help you create a healthy eating plan that is right for you.  Do not smoke or use any tobacco products, such as cigarettes, chewing tobacco, and e-cigarettes. If you need help quitting, ask your health care provider.  Take over-the-counter and prescription medicines as told by your health care provider. You may be prescribed medicines that help lower the risk of type 2 diabetes.  Keep all follow-up visits as told by your health care provider. This is important. Summary  Prediabetes is the condition of having a blood sugar (blood glucose) level that is higher than it should be, but not high enough for you to be diagnosed with type 2 diabetes.  Having prediabetes puts you at risk for developing type 2 diabetes (type 2 diabetes mellitus).  To help prevent type 2 diabetes, make lifestyle changes such as being physically active and eating a healthy diet. Lose weight as told by your health care provider. This information is not intended to replace advice given to you by your health care provider. Make sure you discuss any questions you have with your health care provider. Document Revised: 04/19/2018 Document Reviewed: 02/16/2015 Elsevier Patient Education  [name].

## 2019-04-10 NOTE — Progress Notes (Deleted)
Patient called our office c/o sore throat and sinus pressure. Covid Test was negative yesterday.  He had both Covid Vaccines with the last one being on 04/04/19. Discussed with the provider and these symptoms are probably related to the vaccine. Patient is to take OTC Zyrtec and Flonase for the next 7-10 days, if no improvement or if symptoms worsen then he is to call us back to schedule an appointment.

## 2019-04-23 ENCOUNTER — Other Ambulatory Visit: Payer: Self-pay

## 2019-04-24 ENCOUNTER — Other Ambulatory Visit: Payer: Self-pay

## 2019-05-08 ENCOUNTER — Other Ambulatory Visit: Payer: Self-pay

## 2019-05-08 ENCOUNTER — Encounter: Payer: Self-pay | Admitting: Nurse Practitioner

## 2019-05-08 ENCOUNTER — Ambulatory Visit (INDEPENDENT_AMBULATORY_CARE_PROVIDER_SITE_OTHER): Payer: BC Managed Care – PPO | Admitting: Nurse Practitioner

## 2019-05-08 VITALS — BP 128/68 | HR 88 | Ht 74.5 in | Wt 241.1 lb

## 2019-05-08 DIAGNOSIS — E782 Mixed hyperlipidemia: Secondary | ICD-10-CM

## 2019-05-08 DIAGNOSIS — I10 Essential (primary) hypertension: Secondary | ICD-10-CM

## 2019-05-08 DIAGNOSIS — R931 Abnormal findings on diagnostic imaging of heart and coronary circulation: Secondary | ICD-10-CM

## 2019-05-08 NOTE — Progress Notes (Signed)
Office Visit    Patient Name: Micheal Holland Date of Encounter: 05/08/2019  Primary Care Provider:  Maury Dus, NP Primary Cardiologist:  Kathlyn Sacramento, MD  Chief Complaint    59 year old male with a history of coronary calcium, diastolic dysfunction, hypertension, hyperlipidemia, and family history of premature CAD, who presents for follow-up.  Past Medical History    Past Medical History:  Diagnosis Date  . Allergy   . Coronary artery calcification seen on CT scan    a. 01/2017 CT Cor Ca2+ = 42 (74th %'ile). Ca2+ noted in prox/mid LAD & LCX.  . Diastolic dysfunction    a. 01/2017 Echo: EF 55-60%, Gr2 DD. Triv AI. Ao root 4.0cm (was 3.6 cm on CT however).  . Family history of premature CAD    a. Father died of MI @ 58. Mother died of MI @ 8. Brother multiple strokes.  . Hyperlipidemia   . Hypertension 12/26/2017   Past Surgical History:  Procedure Laterality Date  . COLONOSCOPY WITH PROPOFOL N/A 04/03/2016   Procedure: COLONOSCOPY WITH PROPOFOL;  Surgeon: Lollie Sails, MD;  Location: [facility] ENDOSCOPY;  Service: Endoscopy;  Laterality: N/A;  . none      Allergies  Allergies  Allergen Reactions  . Other Other (See Comments)    Fresh fruit - makes throat itch, canned does not    History of Present Illness    59 year old male with a history of coronary calcium, diastolic dysfunction, hypertension, hyperlipidemia, and family history of premature CAD.  In the setting of significant family history, he underwent cardiac CT for coronary calcium in January 2019 with a calcium score 42 (74th percentile).  Calcium was noted in the proximal and mid LAD and left circumflex.  Echocardiogram performed the same month, showed normal LV function with grade 2 diastolic dysfunction.  He has been managed with aspirin, statin, and antihypertensive therapy.  He was recently placed on amlodipine and feels he has responded well.  He does not routinely check his blood pressures at  home but does sometimes check in on the weekend and notes that numbers have been improved.  He does not routinely exercise but is fairly active.  He denies any chest pain, dyspnea, or change in activity tolerance.  Further, he denies palpitations, PND, orthopnea, dizziness, syncope, edema, or early satiety.  He reports compliance with medications.  Home Medications    Prior to Admission medications   Medication Sig Start Date End Date Taking? Authorizing Provider  amLODipine (NORVASC) 5 MG tablet Take 1 tablet (5 mg total) by mouth daily. 03/06/19   Micheal Dus, NP  aspirin EC 81 MG tablet Take 1 tablet (81 mg total) by mouth daily. 11/07/17   Flinchum, Kelby Aline, FNP  losartan (COZAAR) 50 MG tablet Take 1 tablet (50 mg total) by mouth daily. 12/19/18   Micheal Dus, NP  rosuvastatin (CRESTOR) 40 MG tablet Take 1 tablet (40 mg total) by mouth daily. 03/06/19 06/04/19  Micheal Dus, NP  sildenafil (REVATIO) 20 MG tablet Take 1 tablet (20 mg total) by mouth as needed. Take 1-5 tabs as needed prior to intercourse 11/29/17   Hollice Espy, MD  Vitamin D, Ergocalciferol, (DRISDOL) 1.25 MG (50000 UT) CAPS capsule 1 capsule PO once a week x 12 weeks 09/20/18   Micheal Dus, NP    Review of Systems    He denies chest pain, palpitations, dyspnea, pnd, orthopnea, n, v, dizziness, syncope, edema, weight gain, or early satiety.  All other systems reviewed and  are otherwise negative except as noted above.  Physical Exam    VS:  BP 128/68 (BP Location: Left Arm, Patient Position: Sitting, Cuff Size: Large)   Pulse 88   Ht 6' 2.5" (1.892 m)   Wt 241 lb 2 oz (109.4 kg)   SpO2 97%   BMI 30.54 kg/m  , BMI Body mass index is 30.54 kg/m. GEN: Well nourished, well developed, in no acute distress. HEENT: normal. Neck: Supple, no JVD, carotid bruits, or masses. Cardiac: RRR, no murmurs, rubs, or gallops. No clubbing, cyanosis, edema.  Radials/PT 2+ and equal bilaterally.    Respiratory:  Respirations regular and unlabored, clear to auscultation bilaterally. GI: Soft, nontender, nondistended, BS + x 4. MS: no deformity or atrophy. Skin: warm and dry, no rash. Neuro:  Strength and sensation are intact. Psych: Normal affect.  Accessory Clinical Findings    ECG personally reviewed by me today -regular sinus rhythm, 100, nonspecific T changes- no acute changes.  Lab Results  Component Value Date   WBC 5.1 04/01/2019   HGB 14.6 04/01/2019   HCT 42.2 04/01/2019   MCV 88 04/01/2019   PLT 306 04/01/2019   Lab Results  Component Value Date   CREATININE 1.15 04/01/2019   BUN 12 04/01/2019   NA 140 04/01/2019   K 4.3 04/01/2019   CL 104 04/01/2019   CO2 18 (L) 02/28/2018   Lab Results  Component Value Date   ALT 33 04/01/2019   AST 33 04/01/2019   GGT 71 (H) 04/01/2019   ALKPHOS 60 04/01/2019   BILITOT 0.4 04/01/2019   Lab Results  Component Value Date   CHOL 176 04/01/2019   HDL 58 04/01/2019   LDLCALC 99 04/01/2019   TRIG 103 04/01/2019   CHOLHDL 3.0 04/01/2019    Lab Results  Component Value Date   HGBA1C 6.2 (H) 04/01/2019    Assessment & Plan    1.  Abnormal coronary calcium score: Evaluate in January 2019 with a calcium score of 54, with calcium primarily noted in the proximal and mid LAD and left circumflex.  He remains asymptomatic on aspirin and statin therapy with a recent LDL of 99.  2.  Essential hypertension: Blood pressures elevated earlier this year resulting in the addition of amlodipine therapy.  He believes pressures have been better since then and he reports good compliance.  Pressure today was initially 134/90 however he repeated this and got 128/68.  Continue amlodipine and losartan.  3.  Hyperlipidemia: Remains on statin therapy with a recent LDL of 99.  4.  Disposition: Follow-up in 6 to 12 months or sooner if necessary.  [name], NP 05/08/2019, 1:47 PM

## 2019-05-08 NOTE — Patient Instructions (Signed)
Medication Instructions:  Your physician recommends that you continue on your current medications as directed. Please refer to the Current Medication list given to you today.  *If you need a refill on your cardiac medications before your next appointment, please call your pharmacy*   Lab Work: None ordered  If you have labs (blood work) drawn today and your tests are completely normal, you will receive your results only by: . MyChart Message (if you have MyChart) OR . A paper copy in the mail If you have any lab test that is abnormal or we need to change your treatment, we will call you to review the results.   Testing/Procedures: None ordered    Follow-Up: At [facility], you and your health needs are our priority.  As part of our continuing mission to provide you with exceptional heart care, we have created designated Provider Care Teams.  These Care Teams include your primary Cardiologist (physician) and Advanced Practice Providers (APPs -  Physician Assistants and Nurse Practitioners) who all work together to provide you with the care you need, when you need it.  We recommend signing up for the patient portal called "MyChart".  Sign up information is provided on this After Visit Summary.  MyChart is used to connect with patients for Virtual Visits (Telemedicine).  Patients are able to view lab/test results, encounter notes, upcoming appointments, etc.  Non-urgent messages can be sent to your provider as well.   To learn more about what you can do with MyChart, go to [email or web address].    Your next appointment:   12 month(s)  The format for your next appointment:   In Person  Provider:    You may see Muhammad Arida, MD or Christopher Berge, NP 

## 2019-06-23 ENCOUNTER — Other Ambulatory Visit: Payer: Self-pay | Admitting: Nurse Practitioner

## 2019-06-23 DIAGNOSIS — I10 Essential (primary) hypertension: Secondary | ICD-10-CM

## 2019-06-23 MED ORDER — LOSARTAN POTASSIUM 50 MG PO TABS: 50.0000 mg | ORAL_TABLET | Freq: Every day | ORAL | 1 refills | Status: DC

## 2019-06-23 NOTE — Progress Notes (Signed)
Request med RF per note front office staff via basket. Last chronic OV 04/2019. Med sent to [place] #1 RF.

## 2019-07-03 ENCOUNTER — Ambulatory Visit: Payer: BC Managed Care – PPO | Admitting: Nurse Practitioner

## 2019-07-03 ENCOUNTER — Other Ambulatory Visit: Payer: Self-pay

## 2019-07-10 ENCOUNTER — Ambulatory Visit: Payer: BC Managed Care – PPO | Admitting: Nurse Practitioner

## 2019-07-10 ENCOUNTER — Encounter: Payer: Self-pay | Admitting: Nurse Practitioner

## 2019-07-10 ENCOUNTER — Other Ambulatory Visit: Payer: Self-pay

## 2019-09-09 ENCOUNTER — Other Ambulatory Visit: Payer: Self-pay | Admitting: Medical

## 2019-09-09 DIAGNOSIS — I10 Essential (primary) hypertension: Secondary | ICD-10-CM

## 2019-09-09 MED ORDER — AMLODIPINE BESYLATE 5 MG PO TABS: 5.0000 mg | ORAL_TABLET | Freq: Every day | ORAL | 0 refills | Status: DC

## 2019-10-08 ENCOUNTER — Other Ambulatory Visit: Payer: Self-pay | Admitting: Medical

## 2019-10-08 DIAGNOSIS — I10 Essential (primary) hypertension: Secondary | ICD-10-CM

## 2019-10-08 DIAGNOSIS — R7309 Other abnormal glucose: Secondary | ICD-10-CM

## 2019-10-08 DIAGNOSIS — E559 Vitamin D deficiency, unspecified: Secondary | ICD-10-CM

## 2019-10-08 NOTE — Progress Notes (Signed)
Orders placed to monitor kidney and liver function. Vit D level, and A1C. After resultant patient to have  Annual physical exam with Apolonio Schneiders NP.

## 2019-10-09 ENCOUNTER — Other Ambulatory Visit: Payer: Self-pay

## 2019-10-09 ENCOUNTER — Other Ambulatory Visit: Payer: BC Managed Care – PPO

## 2019-10-09 DIAGNOSIS — E559 Vitamin D deficiency, unspecified: Secondary | ICD-10-CM

## 2019-10-09 DIAGNOSIS — R7309 Other abnormal glucose: Secondary | ICD-10-CM

## 2019-10-09 DIAGNOSIS — I10 Essential (primary) hypertension: Secondary | ICD-10-CM

## 2019-10-10 LAB — COMPREHENSIVE METABOLIC PANEL
ALT: 37 IU/L (ref 0–44)
AST: 33 IU/L (ref 0–40)
Albumin/Globulin Ratio: 1.9 (ref 1.2–2.2)
Albumin: 4.7 g/dL (ref 3.8–4.9)
Alkaline Phosphatase: 61 IU/L (ref 44–121)
BUN/Creatinine Ratio: 11 (ref 9–20)
BUN: 12 mg/dL (ref 6–24)
Bilirubin Total: 0.5 mg/dL (ref 0.0–1.2)
CO2: 22 mmol/L (ref 20–29)
Calcium: 9.5 mg/dL (ref 8.7–10.2)
Chloride: 104 mmol/L (ref 96–106)
Creatinine, Ser: 1.05 mg/dL (ref 0.76–1.27)
GFR calc Af Amer: 91 mL/min/{1.73_m2} (ref >59)
GFR calc non Af Amer: 78 mL/min/{1.73_m2} (ref >59)
Globulin, Total: 2.5 g/dL (ref 1.5–4.5)
Glucose: 107 mg/dL — ABNORMAL HIGH (ref 65–99)
Potassium: 4.2 mmol/L (ref 3.5–5.2)
Sodium: 139 mmol/L (ref 134–144)
Total Protein: 7.2 g/dL (ref 6.0–8.5)

## 2019-10-10 LAB — VITAMIN D 25 HYDROXY (VIT D DEFICIENCY, FRACTURES): Vit D, 25-Hydroxy: 28.8 ng/mL — ABNORMAL LOW (ref 30.0–100.0)

## 2019-10-10 LAB — HGB A1C W/O EAG: Hgb A1c MFr Bld: 6.4 % — ABNORMAL HIGH (ref 4.8–5.6)

## 2019-10-16 ENCOUNTER — Ambulatory Visit: Payer: BC Managed Care – PPO | Admitting: Nurse Practitioner

## 2019-10-16 ENCOUNTER — Ambulatory Visit: Payer: BC Managed Care – PPO | Admitting: Medical

## 2019-10-16 ENCOUNTER — Other Ambulatory Visit: Payer: Self-pay

## 2019-10-16 VITALS — BP 127/85 | HR 89 | Temp 97.0°F | Resp 16 | Ht 74.5 in | Wt 235.0 lb

## 2019-10-16 DIAGNOSIS — Z125 Encounter for screening for malignant neoplasm of prostate: Secondary | ICD-10-CM

## 2019-10-16 DIAGNOSIS — E785 Hyperlipidemia, unspecified: Secondary | ICD-10-CM

## 2019-10-16 NOTE — Progress Notes (Signed)
Subjective:     Patient ID: Micheal Holland, male   DOB: 07-Feb-1960, 59 y.o.   MRN: [id number]  HPI 59 year old male here for annual physical.   Patient has a history of HTN, hyperlipidemia, prediabetes, and Vitamin D deficiency.  Last lipid panel from 03/2019 was WNL, kidney and liver function from labs 09/2019 WNL.  A1C has continued to increase and was 6.4 with most recent labs 09/2019.  Vitamin D has dropped to 28.8.  He is seen by Dr. Rogue Jury at [facility] and was last seen 04/2019 with recommended follow up in 6-8 months. EKF performed 04/2019.   Patient states that he has stopped his Crestor as he believes he was told to stop that and Vitamin D at his last visit here in March. He has also stopped taking his ASA.   Continues to take Norvasc and Losartan  Discussed usual diet with patient, he has eggs bacon and 3 pieces of toast for breakfast daily, two hot dogs for lunch and cooks dinner at home. Usually has some form of bread with dinner. He does not drink soda or sweet tea, denies high intake of other sugars. Has met with Nutritionist at [facility] in the past (2 years ago) and does not have interest in meeting with them again.   Does not exercise regularly but has an active job at [employer], currently working second shift.   Was referred to Chiropractor 02/2019 for lower back pain that he now states is resolved.  Colonoscopy was 2018- next will be 2028.  Had J&J COVID-19 vaccine 08/2019 per patient record not in chart.   No flu shot yet this season.   Denies any health concerns or complaints today.   Denies a tobacco history.  Does have 1-2 alcoholic beverages on days he does not work.   Today's Vitals   10/16/19 0838  BP: 127/85  Pulse: 89  Resp: 16  Temp: (!) 97 F (36.1 C)  TempSrc: Temporal  SpO2: 99%  Weight: 235 lb (106.6 kg)  Height: 6' 2.5" (1.892 m)   Body mass index is 29.77 kg/m.   Allergies  Allergen Reactions  . Other Other (See Comments)    Fresh  fruit - makes throat itch, canned does not    Current Outpatient Medications:  .  amLODipine (NORVASC) 5 MG tablet, Take 1 tablet by mouth once daily, Disp: 30 tablet, Rfl: 0 .  losartan (COZAAR) 50 MG tablet, Take 1 tablet (50 mg total) by mouth daily., Disp: 90 tablet, Rfl: 1 .  sildenafil (REVATIO) 20 MG tablet, Take 1 tablet (20 mg total) by mouth as needed. Take 1-5 tabs as needed prior to intercourse, Disp: 30 tablet, Rfl: 11 .  aspirin EC 81 MG tablet, Take 1 tablet (81 mg total) by mouth daily. (Patient not taking: Reported on 10/16/2019), Disp: 90 tablet, Rfl: 3 .  rosuvastatin (CRESTOR) 40 MG tablet, Take 1 tablet (40 mg total) by mouth daily., Disp: 90 tablet, Rfl: 3 .  Vitamin D, Ergocalciferol, (DRISDOL) 1.25 MG (50000 UT) CAPS capsule, 1 capsule PO once a week x 12 weeks (Patient not taking: Reported on 10/16/2019), Disp: 12 capsule, Rfl: 0   Past Medical History:  Diagnosis Date  . Allergy   . Coronary artery calcification seen on CT scan    a. 01/2017 CT Cor Ca2+ = 42 (74th %'ile). Ca2+ noted in prox/mid LAD & LCX.  . Diastolic dysfunction    a. 01/2017 Echo: EF 55-60%, Gr2 DD. Triv AI.  Ao root 4.0cm (was 3.6 cm on CT however).  . Family history of premature CAD    a. Father died of MI @ 80. Mother died of MI @ 46. Brother multiple strokes.  . Hyperlipidemia   . Hypertension 12/26/2017   Past Surgical History:  Procedure Laterality Date  . COLONOSCOPY WITH PROPOFOL N/A 04/03/2016   Procedure: COLONOSCOPY WITH PROPOFOL;  Surgeon: Lollie Sails, MD;  Location: [facility] ENDOSCOPY;  Service: Endoscopy;  Laterality: N/A;  . none      Family History  Problem Relation Age of Onset  . Hypertension Mother        heart disease died 68   . Heart disease Mother   . Heart attack Mother   . Heart disease Father        "hole  behind heart65 years old died from this "  . Hypertension Father   . Heart attack Father   . Kidney disease Sister        one sister has had transplant  kidney / one sister died with tumor was J. witness did not do surgery  . Heart Problems Sister   . Heart disease Brother        heart disease "hole behind heart"  . Heart attack Brother      Review of Systems  Constitutional: Negative.   HENT: Negative.   Eyes: Negative.   Respiratory: Negative.   Cardiovascular: Negative.   Gastrointestinal: Negative.   Genitourinary: Negative.   Musculoskeletal: Negative.   Skin: Negative.   Neurological: Negative.   Hematological: Negative.   Psychiatric/Behavioral: Negative.        Objective:   Physical Exam Constitutional:      Appearance: Normal appearance.  HENT:     Head: Normocephalic.     Right Ear: Tympanic membrane, ear canal and external ear normal.     Left Ear: Tympanic membrane, ear canal and external ear normal.     Nose: Nose normal.     Mouth/Throat:     Mouth: Mucous membranes are moist.     Pharynx: Oropharynx is clear.  Eyes:     Pupils: Pupils are equal, round, and reactive to light.  Cardiovascular:     Rate and Rhythm: Normal rate and regular rhythm.  Pulmonary:     Effort: Pulmonary effort is normal.     Breath sounds: Normal breath sounds.  Abdominal:     General: Abdomen is flat. Bowel sounds are normal.     Palpations: Abdomen is soft.  Musculoskeletal:        General: Normal range of motion.     Cervical back: Normal range of motion.  Skin:    General: Skin is warm and dry.     Capillary Refill: Capillary refill takes less than 2 seconds.  Neurological:     General: No focal deficit present.     Mental Status: He is alert and oriented to person, place, and time. Mental status is at baseline.  Psychiatric:        Mood and Affect: Mood normal.        Behavior: Behavior normal.        Thought Content: Thought content normal.        Judgment: Judgment normal.       Recent Results (from the past 2160 hour(s))  Comprehensive metabolic panel     Status: Abnormal   Collection Time: 10/09/19   7:56 AM  Result Value Ref Range   Glucose 107 (H) 65 -  99 mg/dL   BUN 12 6 - 24 mg/dL   Creatinine, Ser 1.05 0.76 - 1.27 mg/dL   GFR calc non Af Amer 78 >59 mL/min/1.73   GFR calc Af Amer 91 >59 mL/min/1.73    Comment: **[facility] currently reports eGFR in compliance with the current**   recommendations of the [employer]. [facility] will   update reporting as new guidelines are published from the NKF-ASN   Task force.    BUN/Creatinine Ratio 11 9 - 20   Sodium 139 134 - 144 mmol/L   Potassium 4.2 3.5 - 5.2 mmol/L   Chloride 104 96 - 106 mmol/L   CO2 22 20 - 29 mmol/L   Calcium 9.5 8.7 - 10.2 mg/dL   Total Protein 7.2 6.0 - 8.5 g/dL   Albumin 4.7 3.8 - 4.9 g/dL   Globulin, Total 2.5 1.5 - 4.5 g/dL   Albumin/Globulin Ratio 1.9 1.2 - 2.2   Bilirubin Total 0.5 0.0 - 1.2 mg/dL   Alkaline Phosphatase 61 44 - 121 IU/L    Comment:               **Please note reference interval change**   AST 33 0 - 40 IU/L   ALT 37 0 - 44 IU/L  VITAMIN D 25 Hydroxy (Vit-D Deficiency, Fractures)     Status: Abnormal   Collection Time: 10/09/19  7:56 AM  Result Value Ref Range   Vit D, 25-Hydroxy 28.8 (L) 30.0 - 100.0 ng/mL    Comment: Vitamin D deficiency has been defined by the Institute of Medicine and an Endocrine Society practice guideline as a level of serum 25-OH vitamin D less than 20 ng/mL (1,2). The Endocrine Society went on to further define vitamin D insufficiency as a level between 21 and 29 ng/mL (2). 1. IOM (Institute of Medicine). 2010. Dietary reference    intakes for calcium and D. [place]: The    [employer]. 2. Holick MF, Binkley Strawberry, Bischoff-Ferrari HA, et al.    Evaluation, treatment, and prevention of vitamin D    deficiency: an Endocrine Society clinical practice    guideline. JCEM. 2011 Jul; 96(7):1911-30.   Hgb A1c w/o eAG     Status: Abnormal   Collection Time: 10/09/19  7:56 AM  Result Value Ref Range   Hgb A1c MFr Bld 6.4 (H) 4.8 - 5.6 %     Comment:          Prediabetes: 5.7 - 6.4          Diabetes: >6.4          Glycemic control for adults with diabetes: <7.0       Assessment:     Patient needs annual FLU vaccine, recommended patient would like to return at a later date for this.   Needs assessment of Lipids since Crestor was stopped 6 months ago  Needs to restart Vitamin D, levels have dropped since stopping supplement   Needs follow up with Cardiology in the next 3 months  Next Colonoscopy 2028   Continue annual eye exam and dental exams with outside providers   Needs PSA no record found in chart.   Needs dietary counseling for A1C control    Plan:     Orders Placed This Encounter  Procedures  . Lipid Panel With LDL/HDL Ratio  . PSA, total and free    Will check lipids today to seek clarity on why Crestor was stopped and follow up with results and further recommendations  with patient when results are available  Recommended to restart Vitamin D supplement Rx will be sent to pharmacy for patient.   Patient to call Cardiology to set up follow up appointment. Discuss with cardiologist need for daily ASA as discussed in clinic today.   Discussed dietary changes and will plan to recheck A1C in January, counseled patient on decreasing bread intake and focusing on whole wheat and whole grain products if possible. May consider initiating Metformin if A1C continues to climb with recheck.   Spent an extended time counseling patient on importance of glycemic control, maintaining cholesterol levels with diet or medication and exercise to prevent CVD/stroke. Patient seems moderately motivated to change, would prefer lifestyle intervention to medications if possible. Offered a follow up with nutritionist patient declined.    RTC for flu vaccine as discussed.   Repeat all labs next spring 03/2020 and schedule annual physical at that time.   RTC with any health concerns as discussed.

## 2019-10-16 NOTE — Patient Instructions (Signed)
Look for whole wheat or whole grain breads low in sugar.  Reduce to 1 hot dog bun daily  Remove bread or use whole grain bread with dinner   Schedule follow up with Cardiology for December/January  [phone]  Return to [facility] for follow up in January will recheck hemaglobin A1C at that time for sugar monitoring.   Get a flu shot when ready available at [facility]   Restart Vitamin D supplement as discussed.

## 2019-10-17 ENCOUNTER — Other Ambulatory Visit: Payer: Self-pay | Admitting: Nurse Practitioner

## 2019-10-17 DIAGNOSIS — E785 Hyperlipidemia, unspecified: Secondary | ICD-10-CM

## 2019-10-17 LAB — LIPID PANEL WITH LDL/HDL RATIO
Cholesterol, Total: 152 mg/dL (ref 100–199)
HDL: 53 mg/dL (ref >39)
LDL Chol Calc (NIH): 52 mg/dL (ref 0–99)
LDL/HDL Ratio: 1 ratio (ref 0.0–3.6)
Triglycerides: 306 mg/dL — ABNORMAL HIGH (ref 0–149)
VLDL Cholesterol Cal: 47 mg/dL — ABNORMAL HIGH (ref 5–40)

## 2019-10-17 LAB — PSA, TOTAL AND FREE
PSA, Free Pct: 30 %
PSA, Free: 0.09 ng/mL
Prostate Specific Ag, Serum: 0.3 ng/mL (ref 0.0–4.0)

## 2019-10-17 MED ORDER — VITAMIN D (CHOLECALCIFEROL) 25 MCG (1000 UT) PO CAPS: 1.0000 | ORAL_CAPSULE | Freq: Every morning | ORAL | 3 refills | Status: DC

## 2019-10-17 MED ORDER — ROSUVASTATIN CALCIUM 40 MG PO TABS: 40.0000 mg | ORAL_TABLET | Freq: Every day | ORAL | 3 refills | Status: DC

## 2019-10-17 NOTE — Progress Notes (Signed)
Patient ID: Micheal Holland, male   DOB: 08-09-1960, 59 y.o.   MRN: [id number]    Left message for patient to let him know that he needs to pick up and restart Crestor and Vitamin D due to recent lab results.  Rx sent to [facility].  Follow up in clinic in three months for A1C recheck  Earlier with any concerns.

## 2019-11-06 ENCOUNTER — Other Ambulatory Visit: Payer: Self-pay | Admitting: Medical

## 2019-11-06 DIAGNOSIS — I10 Essential (primary) hypertension: Secondary | ICD-10-CM

## 2019-12-08 ENCOUNTER — Other Ambulatory Visit: Payer: Self-pay | Admitting: Nurse Practitioner

## 2019-12-08 DIAGNOSIS — I10 Essential (primary) hypertension: Secondary | ICD-10-CM

## 2019-12-08 MED ORDER — AMLODIPINE BESYLATE 5 MG PO TABS: 5.0000 mg | ORAL_TABLET | Freq: Every day | ORAL | 0 refills | Status: DC

## 2019-12-08 NOTE — Progress Notes (Signed)
Refilled norvasc 38m once daily

## 2020-01-08 ENCOUNTER — Other Ambulatory Visit: Payer: Self-pay | Admitting: Nurse Practitioner

## 2020-01-08 DIAGNOSIS — I10 Essential (primary) hypertension: Secondary | ICD-10-CM

## 2020-01-08 MED ORDER — LOSARTAN POTASSIUM 50 MG PO TABS: 50.0000 mg | ORAL_TABLET | Freq: Every day | ORAL | 1 refills | Status: DC

## 2020-01-08 NOTE — Progress Notes (Signed)
Cozaar refilled 

## 2020-02-10 ENCOUNTER — Other Ambulatory Visit: Payer: Self-pay

## 2020-02-10 ENCOUNTER — Ambulatory Visit: Payer: BC Managed Care – PPO | Admitting: Nurse Practitioner

## 2020-02-10 ENCOUNTER — Encounter: Payer: Self-pay | Admitting: Nurse Practitioner

## 2020-02-10 VITALS — BP 133/84 | HR 74 | Temp 98.2°F | Resp 16 | Wt 223.0 lb

## 2020-02-10 DIAGNOSIS — M62830 Muscle spasm of back: Secondary | ICD-10-CM

## 2020-02-10 MED ORDER — CYCLOBENZAPRINE HCL 5 MG PO TABS: 5.0000 mg | ORAL_TABLET | Freq: Every evening | ORAL | 1 refills | Status: DC | PRN

## 2020-02-10 NOTE — Progress Notes (Signed)
   Subjective:    Patient ID: Micheal Holland, male    DOB: 06-Oct-1960, 60 y.o.   MRN: [id number]  HPI  60 year old male presenting with complaints of pain to right upper back for the past week. This started when he was reaching up for something at home.   Pain has slightly improved since that time and improves when he takes tylenol OTC.   Denies any weakness or numbness to extremities.  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   Denies a history of injury to this area   Today's Vitals   02/10/20 1044  BP: 133/84  Pulse: 74  Resp: 16  Temp: 98.2 F (36.8 C)  TempSrc: Oral  SpO2: 100%  Weight: 223 lb (101.2 kg)   Body mass index is 28.25 kg/m.   Review of Systems  Constitutional: Negative.   Musculoskeletal: Positive for myalgias.  Neurological: Negative for weakness and numbness.       Objective:   Physical Exam Musculoskeletal:       Arms:     Comments: Area of tenderness to highlighted region. Pain increases with right arm extension across chest and with arm raise. Able to shrug shoulders and move upper extremities with full ROM.   Neurological:     General: No focal deficit present.     Mental Status: He is alert.     Sensory: Sensation is intact.     Motor: No weakness.     Coordination: Coordination is intact.     Comments: Hand and arm strength in bilateral upper extremities 5/5           Assessment & Plan:  Advised rest from heavy lifting, patient denies a requirement for heavy lifting at his job and feels he can perform the job duties without accomodation at this time.  He may continue to use tylenol OTC for pain control as needed per package instructions and not to exceed 3,000mg  daily.   Patient will try Flexeril in the evenings before bed, advised he may not use this if he has been drinking alcohol and patient verbalizes understanding of these instructions.   He will  return to clinic with any new or worsening symptoms or if symptoms are consistent for another week and will consider PT referral at that time as discussed.   Meds ordered this encounter  Medications  . cyclobenzaprine (FLEXERIL) 5 MG tablet    Sig: Take 1 tablet (5 mg total) by mouth at bedtime as needed for muscle spasms.    Dispense:  15 tablet    Refill:  1

## 2020-02-10 NOTE — Patient Instructions (Signed)
Muscle Strain A muscle strain is an injury that occurs when a muscle is stretched beyond its normal length. Usually, a small number of muscle fibers are torn when this happens. There are three types of muscle strains. First-degree strains have the least amount of muscle fiber tearing and the least amount of pain. Second-degree and third-degree strains have more tearing and pain. Usually, recovery from muscle strain takes 1-2 weeks. Complete healing normally takes 5-6 weeks. What are the causes? This condition is caused when a sudden, violent force is placed on a muscle and stretches it too far. This may occur with a fall, lifting, or sports. What increases the risk? This condition is more likely to develop in athletes and people who are physically active. What are the signs or symptoms? Symptoms of this condition include:  Pain.  Bruising.  Swelling.  Trouble using the muscle. How is this diagnosed? This condition is diagnosed based on a physical exam and your medical history. Tests may also be done, including an X-ray, ultrasound, or MRI. How is this treated? This condition is initially treated with PRICE therapy. This therapy involves:  Protecting the muscle from being injured again.  Resting the injured muscle.  Icing the injured muscle.  Applying pressure (compression) to the injured muscle. This may be done with a splint or elastic bandage.  Raising (elevating) the injured muscle. Your health care provider may also recommend medicine for pain. Follow these instructions at home: If you have a splint:  Wear the splint as told by your health care provider. Remove it only as told by your health care provider.  Loosen the splint if your fingers or toes tingle, become numb, or turn cold and blue.  Keep the splint clean.  If the splint is not waterproof: ? Do not let it get wet. ? Cover it with a watertight covering when you take a bath or a shower. Managing pain, stiffness,  and swelling  If directed, put ice on the injured area: ? If you have a removable splint, remove it as told by your health care provider. ? Put ice in a plastic bag. ? Place a towel between your skin and the bag. ? Leave the ice on for 20 minutes, 2-3 times a day.  Move your fingers or toes often to avoid stiffness and to lessen swelling.  Raise (elevate) the injured area above the level of your heart while you are sitting or lying down.  Wear an elastic bandage as told by your health care provider. Make sure that it is not too tight.   General instructions  Treatment may include medicines for pain and inflammation that are taken by mouth or applied to the skin, prescription pain medicine, or muscle relaxants. Take over-the-counter and prescription medicines only as told by your health care provider.  Restrict your activity and rest the injured muscle as told by your health care provider. Gentle movements may be allowed.  If physical therapy was prescribed, do exercises as told by your health care provider.  Do not put pressure on any part of the splint until it is fully hardened. This may take several hours.  Do not use any products that contain nicotine or tobacco, such as cigarettes and e-cigarettes. These can delay bone healing. If you need help quitting, ask your health care provider.  Ask your health care provider when it is safe to drive if you have a splint.  Keep all follow-up visits as told by your health care provider. This   is important. How is this prevented?  Warm up before exercising. This helps to prevent future muscle strains. Contact a health care provider if:  You have more pain or swelling in the injured area. Get help right away if:  You have numbness or tingling or lose a lot of strength in the injured area. Summary  A muscle strain is an injury that occurs when a muscle is stretched beyond its normal length.  This condition is caused when a sudden,  violent force is placed on a muscle and stretches it too far.  This condition is initially treated with PRICE therapy, which involves protecting, resting, icing, compressing, and elevating.  Gentle movements may be allowed. If physical therapy was prescribed, do exercises as told by your health care provider. This information is not intended to replace advice given to you by your health care provider. Make sure you discuss any questions you have with your health care provider. Document Revised: 09/19/2019 Document Reviewed: 09/19/2019 Elsevier Patient Education  2021 [employer]  

## 2020-03-02 ENCOUNTER — Ambulatory Visit: Payer: BC Managed Care – PPO | Admitting: Medical

## 2020-03-02 ENCOUNTER — Encounter: Payer: Self-pay | Admitting: Medical

## 2020-03-02 ENCOUNTER — Other Ambulatory Visit: Payer: Self-pay

## 2020-03-02 VITALS — BP 140/90 | HR 81 | Temp 98.1°F | Resp 16 | Wt 223.0 lb

## 2020-03-02 DIAGNOSIS — R040 Epistaxis: Secondary | ICD-10-CM

## 2020-03-02 DIAGNOSIS — Z760 Encounter for issue of repeat prescription: Secondary | ICD-10-CM

## 2020-03-02 DIAGNOSIS — I10 Essential (primary) hypertension: Secondary | ICD-10-CM

## 2020-03-02 MED ORDER — AMLODIPINE BESYLATE 5 MG PO TABS: 5.0000 mg | ORAL_TABLET | Freq: Every day | ORAL | 0 refills | Status: DC

## 2020-03-02 NOTE — Patient Instructions (Signed)
OTC Zyrtec daily Flonase  Daily      Afrin  When bleeding to each nostril to help stop bleeding , follow directions below.       Nosebleed, Adult A nosebleed is when blood comes out of the nose. Nosebleeds are common and can be caused by many things. They are usually not a sign of a serious medical problem. Follow these instructions at home: When you have a nosebleed:  Sit down.  Tilt your head a little forward.  Follow these steps: 1. Pinch your nose with a clean towel or tissue. 2. Keep pinching your nose for 5 minutes. Do not let go. 3. After 5 minutes, let go of your nose. 4. If there is still bleeding, do these steps again. Keep doing these steps until the bleeding stops.  Do not put tissues or other things in your nose to stop the bleeding.  Avoid lying down or putting your head back.  Use a nose spray decongestant as told by your doctor.   After a nosebleed:  Try not to blow your nose or sniffle for several hours.  Try not to strain, lift, or bend at the waist for several days.  Aspirin and blood-thinning medicines make bleeding more likely. If you take these medicines: ? Ask your doctor if you should stop taking them or if you should change how much you take. ? Do not stop taking the medicine unless your doctor tells you to.  If your nosebleed was caused by dryness, use over-the-counter saline nasal spray or gel and humidifier as told by your doctor. This will keep the inside of your nose moist and allow it to heal. If you need to use one of these products: ? Choose one that is water-soluble. ? Use only as much as you need and use it only as often as needed. ? Do not lie down right away after you use it.  If you get nosebleeds often, talk with your doctor about treatments. These may include: ? Nasal cautery. A chemical swab or electrical device is used to lightly burn tiny blood vessels inside the nose. This helps stop or prevent nosebleeds. ? Nasal packing.  A gauze or other material is placed in the nose to keep constant pressure on the bleeding area. Contact a doctor if:  You have a fever.  You get nosebleeds often.  You are getting nosebleeds more often than usual.  You bruise very easily.  You have something stuck in your nose.  You have bleeding in your mouth.  You vomit or cough up brown material.  You get a nosebleed after you start a new medicine. Get help right away if:  You have a nosebleed after you fall or hurt your head.  Your nosebleed does not go away after 20 minutes.  You feel dizzy or weak.  You have unusual bleeding from other parts of your body.  You have unusual bruising on other parts of your body.  You get sweaty.  You vomit blood. Summary  Nosebleeds are common. They are usually not a sign of a serious medical problem.  When you have a nosebleed, sit down and tilt your head a little forward. Pinch your nose with a clean tissue for 5 minutes.  Use saline spray or saline gel and a humidifier as told by your doctor.  Get help right away if your nosebleed does not go away after 20 minutes. This information is not intended to replace advice given to you by your  health care provider. Make sure you discuss any questions you have with your health care provider. Document Revised: 10/24/2018 Document Reviewed: 10/24/2018 Elsevier Patient Education  2021 [employer].

## 2020-03-02 NOTE — Progress Notes (Signed)
Subjective:    Patient ID: Micheal Holland, male    DOB: [birth year], 60 y.o.   MRN: [id number]  HPI 60 yo male in non acute distress, presents today with complaints of nose bleeding from the left side of his nose and then sometimes the right starts.Marland Kitchen  occurs 1-2 times a day (not every day but about 5 times a week.). When he blows his nose a clot comes out and then restarts nose bleed. He also picks his nose to keep it clean.  He stopped ASA 3 weeks ago, he stopped because he did not like taking so many pills.   Sunday he  Took his blood pressure and BP was  145/100, he states he has been taking blood pressure medication as prescriibed. The reason he took his blood pressure Was because his arm felt heavy.  Has a nosebleed this morning at 1:30 am when picking up his cousin who is paralyzed on left side due to a stroke ( he was taking him to [facility]). He states sometime nosebleeds with doing work, change in environment from inside to outside and also spontaneously. He holds his nose just below the bridge "on th hard part" and  Nosebleed usually lasts  15 min. He denies any lightheadedness, dizziness, visual changes, or headache when the nosebleed occurs.  Review of Systems  Constitutional: Negative for chills, fatigue and fever.  HENT: Positive for nosebleeds. Negative for postnasal drip, rhinorrhea, sinus pressure, sinus pain and sneezing.   Eyes: Negative for visual disturbance.  Respiratory: Negative for cough and shortness of breath.   Cardiovascular: Negative for chest pain, palpitations and leg swelling.  Musculoskeletal:       Left arm felt "funny"" heavy" no numbness or tingling and could move arm without difficulty. No leg problems.  No prior history of  Arm heaviness.  Allergic/Immunologic: Positive for environmental allergies.  Neurological: Negative for dizziness, tremors, syncope, facial asymmetry, speech difficulty, weakness, light-headedness, numbness and  headaches.       Objective:   Physical Exam Vitals and nursing note reviewed.  Constitutional:      Appearance: Normal appearance.  HENT:     Head: Normocephalic and atraumatic.     Nose: Congestion (right side clear muscous noted) present.     Comments: Engorged blood vessels on the left side of nose.    Mouth/Throat:     Mouth: Mucous membranes are moist.     Pharynx: Oropharynx is clear.  Eyes:     Extraocular Movements: Extraocular movements intact.     Pupils: Pupils are equal, round, and reactive to light.     Comments: No nystagmus   Musculoskeletal:        General: Normal range of motion.     Cervical back: Normal range of motion.  Skin:    General: Skin is warm.  Neurological:     General: No focal deficit present.     Mental Status: He is alert and oriented to person, place, and time. Mental status is at baseline.     GCS: GCS eye subscore is 4. GCS verbal subscore is 5. GCS motor subscore is 6.     Cranial Nerves: Cranial nerves are intact. No cranial nerve deficit.     Sensory: Sensation is intact. No sensory deficit.     Motor: Motor function is intact. No weakness.     Coordination: Coordination is intact. Romberg sign negative. Coordination normal. Finger-Nose-Finger Test normal.     Gait: Gait is intact. Gait  normal.  Psychiatric:        Attention and Perception: Attention and perception normal.        Mood and Affect: Mood and affect normal.        Speech: Speech normal.        Behavior: Behavior normal. Behavior is cooperative.        Thought Content: Thought content normal.        Cognition and Memory: Cognition and memory normal.        Judgment: Judgment normal.     Comments: Patient at baseline for him.           Assessment & Plan:  Hypertension ideally would like to see patient at  120/80 Engorged nasal vessels on left side ? Due to high BP or  Seasonal allergies.- restart Zyrtec and Flonase per package instructions. Medication Refill of  Amlodipine 5 mg one po qd. Per patient request. Continue to stop ASA 81 mg.Use Saline Nasal Spray to moisturize the mucosa of the left nare if needed.. Use OTC Afrin nasal spray when you get a nosebleed 2 sprays each nostril then hold nose at the soft part of the nose. ( demonstrated for patient). Do not pick nose, this can irritate the vessels. Reviewed with patient if HA, lightheadedness, dizziness occur to go to the Emergency Department. Reviewed stroke symptoms with patient and that would be a time to go to the Emergency Department. Follow up with Sarah on Blood pressure monitoring and nosebleeds within  7 days. Educational material given to patient. Patient verbalizes understanding and has no  Questions at discharge.

## 2020-03-04 ENCOUNTER — Telehealth: Payer: Self-pay | Admitting: Nurse Practitioner

## 2020-03-05 ENCOUNTER — Ambulatory Visit: Payer: BC Managed Care – PPO | Admitting: Nurse Practitioner

## 2020-03-10 ENCOUNTER — Ambulatory Visit: Payer: BC Managed Care – PPO | Admitting: Nurse Practitioner

## 2020-03-10 ENCOUNTER — Other Ambulatory Visit: Payer: Self-pay

## 2020-03-10 VITALS — BP 126/82 | HR 97 | Temp 98.4°F | Resp 16

## 2020-03-10 DIAGNOSIS — R040 Epistaxis: Secondary | ICD-10-CM

## 2020-03-10 DIAGNOSIS — M6281 Muscle weakness (generalized): Secondary | ICD-10-CM

## 2020-03-10 NOTE — Progress Notes (Signed)
  Subjective:     Patient ID: Micheal Holland, male   DOB: January 30, 1960, 60 y.o.   MRN: [id number]  HPI  60 year old male presenting to clinic for follow up on frequent nose bleeds. Since his visit on 03/02/2020 he has continued to have daily nose bleeds from his left nostril.   He has been using Vaseline to keep inner nose moisturized and is sleeping with a humidifier.   He has set up an appointment with ENT in March already.   He also notes that two weeks ago he experienced "heaviness" in his left hand  He felt like he had to try harder to lift it, and that lasted for about 15 minutes. At the time he had no other symptoms, denies confusion, headache, chest pain or dizziness.   He stopped his Crestor a few months back, says he just stopped. Most recent labs are from 10/2019.   Today's Vitals   03/10/20 1424  BP: 126/82  Pulse: 97  Resp: 16  Temp: 98.4 F (36.9 C)  TempSrc: Oral  SpO2: 99%   There is no height or weight on file to calculate BMI.  Review of Systems  Constitutional: Negative.   HENT: Positive for nosebleeds.   Respiratory: Negative.   Cardiovascular: Negative.   Musculoskeletal: Negative.   Neurological: Positive for weakness.  Psychiatric/Behavioral: Negative.        Objective:   Physical Exam HENT:     Nose:     Right Turbinates: Enlarged and swollen.     Left Turbinates: Enlarged and swollen.  Neurological:     General: No focal deficit present.     Mental Status: He is alert and oriented to person, place, and time.     Sensory: No sensory deficit.     Motor: No weakness.        Assessment:     Frequent epistaxis - referral to ENT continue self care at home as discussed   Transient and resolved left arm weakness; possible TIA. Recently stopped statin history of hyperlipidemia    Plan:     Call ENT to see if earlier appointment available, patient will report to ED with any nosebleed that he cannot contain.   Referral placed to Neurology  has appointment next week   Recheck lipid panel tomorrow am patient to return fasting

## 2020-03-11 ENCOUNTER — Other Ambulatory Visit: Payer: BC Managed Care – PPO

## 2020-03-11 ENCOUNTER — Other Ambulatory Visit: Payer: Self-pay

## 2020-03-11 DIAGNOSIS — E785 Hyperlipidemia, unspecified: Secondary | ICD-10-CM

## 2020-03-11 NOTE — Telephone Encounter (Signed)
Spoke with patient and scheduled follow up in clinic

## 2020-03-11 NOTE — Progress Notes (Signed)
Here for lipid panel

## 2020-03-12 LAB — LIPID PANEL
Chol/HDL Ratio: 2.1 ratio (ref 0.0–5.0)
Cholesterol, Total: 143 mg/dL (ref 100–199)
HDL: 68 mg/dL (ref >39)
LDL Chol Calc (NIH): 54 mg/dL (ref 0–99)
Triglycerides: 119 mg/dL (ref 0–149)
VLDL Cholesterol Cal: 21 mg/dL (ref 5–40)

## 2020-03-12 LAB — HEMOGLOBIN A1C
Est. average glucose Bld gHb Est-mCnc: 131 mg/dL
Hgb A1c MFr Bld: 6.2 % — ABNORMAL HIGH (ref 4.8–5.6)

## 2020-03-16 NOTE — Progress Notes (Signed)
NEUROLOGY FOLLOW UP OFFICE NOTE  Micheal Holland [id number]  Assessment/Plan:   1.  Left hand numbness.  Normal exam.  Nonspecific distribution of numbness potentially can still be carpal tunnel syndrome but would need to rule out secondary intracranial abnormality such as stroke.  Also consider cervical radiculopathy but no associated neck pain or radicular pain.  1.  MRI of brain.  If unremarkable consider NCV-EMG, but so far patient declines that for now.  Further recommendations pending results.  Subjective:  Micheal Holland is a 60 year old left-handed male who presents for new left hand numbness.  History supplemented by referring provider's note.  UPDATE: Previously seen in February 2021 for low back pain and lumbar radicular symptoms.  MRI of lumbar spine at that time showed severe spinal stenosis at L4-L5 and lateral recess narrowing at L3-4.  He was referred to interventional pain management.    Two weeks ago, he said he developed numbness in the left hand.  No preceding neck or arm injury.  It involves the entire hand but predominantly the last 3 digits. Intermittent but has not noticed if it is positional or related to certain activity such as laying in bed, driving or holding a phone.  No neck or cervical radicular pain.  Sometimes has trouble with grip and objects may slip from his hand.  No involvement of right hand, face or lower extremities.  PAST MEDICAL HISTORY: Past Medical History:  Diagnosis Date  . Allergy   . Coronary artery calcification seen on CT scan    a. 01/2017 CT Cor Ca2+ = 42 (74th %'ile). Ca2+ noted in prox/mid LAD & LCX.  . Diastolic dysfunction    a. 01/2017 Echo: EF 55-60%, Gr2 DD. Triv AI. Ao root 4.0cm (was 3.6 cm on CT however).  . Family history of premature CAD    a. Father died of MI @ 11. Mother died of MI @ 25. Brother multiple strokes.  . Hyperlipidemia   . Hypertension 12/26/2017    MEDICATIONS: Current Outpatient Medications on  File Prior to Visit  Medication Sig Dispense Refill  . amLODipine (NORVASC) 5 MG tablet Take 1 tablet (5 mg total) by mouth daily. 90 tablet 0  . aspirin EC 81 MG tablet Take 1 tablet (81 mg total) by mouth daily. 90 tablet 3  . cyclobenzaprine (FLEXERIL) 5 MG tablet Take 1 tablet (5 mg total) by mouth at bedtime as needed for muscle spasms. (Patient not taking: Reported on 03/10/2020) 15 tablet 1  . losartan (COZAAR) 50 MG tablet Take 1 tablet (50 mg total) by mouth daily. 90 tablet 1  . rosuvastatin (CRESTOR) 40 MG tablet Take 1 tablet (40 mg total) by mouth daily. 90 tablet 3  . sildenafil (REVATIO) 20 MG tablet Take 1 tablet (20 mg total) by mouth as needed. Take 1-5 tabs as needed prior to intercourse 30 tablet 11  . Vitamin D, Cholecalciferol, 25 MCG (1000 UT) CAPS Take 1 capsule by mouth every morning. 60 capsule 3  . Vitamin D, Ergocalciferol, (DRISDOL) 1.25 MG (50000 UT) CAPS capsule 1 capsule PO once a week x 12 weeks 12 capsule 0   No current facility-administered medications on file prior to visit.    ALLERGIES: Allergies  Allergen Reactions  . Other Other (See Comments)    Fresh fruit - makes throat itch, canned does not    FAMILY HISTORY: Family History  Problem Relation Age of Onset  . Hypertension Mother  heart disease died 2   . Heart disease Mother   . Heart attack Mother   . Heart disease Father        "hole  behind heart69 years old died from this "  . Hypertension Father   . Heart attack Father   . Kidney disease Sister        one sister has had transplant kidney / one sister died with tumor was J. witness did not do surgery  . Heart Problems Sister   . Heart disease Brother        heart disease "hole behind heart"  . Heart attack Brother       Objective:  Blood pressure 135/82, pulse 85, height 6\' 2"  (1.88 m), weight 221 lb 12.8 oz (100.6 kg), SpO2 99 %. General: No acute distress.  Patient appears well-groomed.   Head:   Normocephalic/atraumatic Eyes:  Fundi examined but not visualized Neck: supple, no paraspinal tenderness, full range of motion Heart:  Regular rate and rhythm Lungs:  Clear to auscultation bilaterally Back: No paraspinal tenderness Neurological Exam: alert and oriented to person, place, and time. Attention span and concentration intact, recent and remote memory intact, fund of knowledge intact.  Speech fluent and not dysarthric, language intact.  CN II-XII intact. Bulk and tone normal, muscle strength 5/5 throughout.  Sensation to pinprick and vibration intact.  Tinel's sign negative. Deep tendon reflexes 2+ throughout, toes downgoing.  Finger to nose and heel to shin testing intact.  Gait normal, Romberg negative.   Metta Clines, DO  CC:  Apolonio Schneiders, FNP

## 2020-03-17 ENCOUNTER — Other Ambulatory Visit: Payer: Self-pay

## 2020-03-17 ENCOUNTER — Encounter: Payer: Self-pay | Admitting: Neurology

## 2020-03-17 ENCOUNTER — Ambulatory Visit: Payer: BC Managed Care – PPO | Admitting: Neurology

## 2020-03-17 VITALS — BP 135/82 | HR 85 | Ht 74.0 in | Wt 221.8 lb

## 2020-03-17 DIAGNOSIS — R200 Anesthesia of skin: Secondary | ICD-10-CM

## 2020-03-17 NOTE — Patient Instructions (Addendum)
Mri of brain. We have sent a referral to [facility] for your MRI and they will call you directly to schedule your appointment. They are located at [place]. If you need to contact them directly please call [phone].   Further recommendations pending results.

## 2020-04-07 ENCOUNTER — Other Ambulatory Visit: Payer: BC Managed Care – PPO

## 2020-06-01 ENCOUNTER — Other Ambulatory Visit: Payer: Self-pay | Admitting: Medical

## 2020-06-01 DIAGNOSIS — I10 Essential (primary) hypertension: Secondary | ICD-10-CM

## 2020-07-03 ENCOUNTER — Other Ambulatory Visit: Payer: Self-pay | Admitting: Nurse Practitioner

## 2020-07-03 DIAGNOSIS — I10 Essential (primary) hypertension: Secondary | ICD-10-CM

## 2020-08-17 ENCOUNTER — Other Ambulatory Visit: Payer: Self-pay | Admitting: Urology

## 2020-08-31 ENCOUNTER — Other Ambulatory Visit: Payer: Self-pay | Admitting: Nurse Practitioner

## 2020-08-31 DIAGNOSIS — I10 Essential (primary) hypertension: Secondary | ICD-10-CM

## 2020-09-07 ENCOUNTER — Other Ambulatory Visit: Payer: BC Managed Care – PPO

## 2020-09-07 ENCOUNTER — Other Ambulatory Visit: Payer: Self-pay

## 2020-09-07 DIAGNOSIS — Z0000 Encounter for general adult medical examination without abnormal findings: Secondary | ICD-10-CM

## 2020-09-07 DIAGNOSIS — E559 Vitamin D deficiency, unspecified: Secondary | ICD-10-CM

## 2020-09-07 NOTE — Progress Notes (Signed)
Labs placed for physical examper Apolonio Schneiders NP

## 2020-09-09 LAB — CMP12+LP+TP+TSH+6AC+PSA+CBC…
ALT: 25 IU/L (ref 0–44)
AST: 31 IU/L (ref 0–40)
Albumin/Globulin Ratio: 2.2 (ref 1.2–2.2)
Albumin: 4.7 g/dL (ref 3.8–4.9)
Alkaline Phosphatase: 64 IU/L (ref 44–121)
BUN/Creatinine Ratio: 13 (ref 9–20)
BUN: 13 mg/dL (ref 6–24)
Basophils Absolute: 0 10*3/uL (ref 0.0–0.2)
Basos: 1 %
Bilirubin Total: 0.6 mg/dL (ref 0.0–1.2)
Calcium: 9.5 mg/dL (ref 8.7–10.2)
Chloride: 105 mmol/L (ref 96–106)
Chol/HDL Ratio: 2.1 ratio (ref 0.0–5.0)
Cholesterol, Total: 149 mg/dL (ref 100–199)
Creatinine, Ser: 0.97 mg/dL (ref 0.76–1.27)
EOS (ABSOLUTE): 0.2 10*3/uL (ref 0.0–0.4)
Eos: 3 %
Estimated CHD Risk: <0.5 times avg. (ref 0.0–1.0)
Free Thyroxine Index: 1.4 (ref 1.2–4.9)
GGT: 29 IU/L (ref 0–65)
Globulin, Total: 2.1 g/dL (ref 1.5–4.5)
Glucose: 119 mg/dL — ABNORMAL HIGH (ref 65–99)
HDL: 71 mg/dL (ref >39)
Hematocrit: 43.6 % (ref 37.5–51.0)
Hemoglobin: 14 g/dL (ref 13.0–17.7)
Immature Grans (Abs): 0 10*3/uL (ref 0.0–0.1)
Immature Granulocytes: 0 %
Iron: 75 ug/dL (ref 38–169)
LDH: 212 IU/L (ref 121–224)
LDL Chol Calc (NIH): 65 mg/dL (ref 0–99)
Lymphocytes Absolute: 1.7 10*3/uL (ref 0.7–3.1)
Lymphs: 36 %
MCH: 28.9 pg (ref 26.6–33.0)
MCHC: 32.1 g/dL (ref 31.5–35.7)
MCV: 90 fL (ref 79–97)
Monocytes Absolute: 0.5 10*3/uL (ref 0.1–0.9)
Monocytes: 10 %
Neutrophils Absolute: 2.3 10*3/uL (ref 1.4–7.0)
Neutrophils: 50 %
Phosphorus: 3.6 mg/dL (ref 2.8–4.1)
Platelets: 273 10*3/uL (ref 150–450)
Potassium: 4 mmol/L (ref 3.5–5.2)
Prostate Specific Ag, Serum: 0.3 ng/mL (ref 0.0–4.0)
RBC: 4.85 x10E6/uL (ref 4.14–5.80)
RDW: 14.1 % (ref 11.6–15.4)
Sodium: 141 mmol/L (ref 134–144)
T3 Uptake Ratio: 26 % (ref 24–39)
T4, Total: 5.5 ug/dL (ref 4.5–12.0)
TSH: 2.98 u[IU]/mL (ref 0.450–4.500)
Total Protein: 6.8 g/dL (ref 6.0–8.5)
Triglycerides: 61 mg/dL (ref 0–149)
Uric Acid: 5.2 mg/dL (ref 3.8–8.4)
VLDL Cholesterol Cal: 13 mg/dL (ref 5–40)
WBC: 4.7 10*3/uL (ref 3.4–10.8)
eGFR: 90 mL/min/{1.73_m2} (ref >59)

## 2020-09-09 LAB — HGB A1C W/O EAG: Hgb A1c MFr Bld: 6.3 % — ABNORMAL HIGH (ref 4.8–5.6)

## 2020-09-09 LAB — VITAMIN D 25 HYDROXY (VIT D DEFICIENCY, FRACTURES): Vit D, 25-Hydroxy: 29 ng/mL — ABNORMAL LOW (ref 30.0–100.0)

## 2020-09-30 ENCOUNTER — Other Ambulatory Visit: Payer: Self-pay | Admitting: Nurse Practitioner

## 2020-09-30 DIAGNOSIS — I10 Essential (primary) hypertension: Secondary | ICD-10-CM

## 2020-10-28 ENCOUNTER — Ambulatory Visit: Payer: BC Managed Care – PPO | Admitting: Nurse Practitioner

## 2020-10-29 ENCOUNTER — Ambulatory Visit: Payer: BC Managed Care – PPO | Admitting: Nurse Practitioner

## 2020-10-29 ENCOUNTER — Other Ambulatory Visit: Payer: Self-pay

## 2020-10-29 ENCOUNTER — Encounter: Payer: Self-pay | Admitting: Nurse Practitioner

## 2020-10-29 VITALS — BP 130/74 | HR 101 | Temp 98.4°F | Resp 16

## 2020-10-29 DIAGNOSIS — Z0000 Encounter for general adult medical examination without abnormal findings: Secondary | ICD-10-CM

## 2020-10-29 DIAGNOSIS — R7303 Prediabetes: Secondary | ICD-10-CM

## 2020-10-29 DIAGNOSIS — I10 Essential (primary) hypertension: Secondary | ICD-10-CM

## 2020-10-29 DIAGNOSIS — N529 Male erectile dysfunction, unspecified: Secondary | ICD-10-CM

## 2020-10-29 DIAGNOSIS — E785 Hyperlipidemia, unspecified: Secondary | ICD-10-CM

## 2020-10-29 MED ORDER — SILDENAFIL CITRATE 20 MG PO TABS: 20.0000 mg | ORAL_TABLET | ORAL | 11 refills | Status: DC | PRN

## 2020-10-29 NOTE — Progress Notes (Signed)
History of Present Illness: Micheal Holland is a 60 y.o. who identifies as a male who was assigned male at birth, and is being seen today for his annual physical at [facility].   The weakness in his left hand has improved, he now notes that in the am his 4th and 5th fingers are "Stiff" he is left handed. Once he starts moving for the day the stiffness improves he has numbness at times.   He was seen by Neurology last year after abrupt onset of left hand weakness. Was ordered for MRI at that time and recommended follow up EMG as needed dependent on MRI results. Patient has not scheduled MRI as symptoms have improved.   He has been treated by Urology for ED in the past requests refill on sildenafil today.   Discussed diet, patient eats 3-4 meals a day, currently working two jobs am at [employer], pm at [employer]. Sleeps on average 5 hours at night.  Drinks on average 3 beers after work.   Family history of DM.   Problems:  Patient Active Problem List   Diagnosis Date Noted   Hypertension 12/26/2017   Elevated coronary artery calcium score 01/29/2017   Vitamin D deficiency October 21, 2016   Alcohol use 10-21-2016   Prediabetes 10-21-2016   Hyperlipidemia Oct 21, 2016   Family history of death due to heart problem at 63 years of age or younger 10/21/2016   History of farsightedness 10-21-16    Allergies:  Allergies  Allergen Reactions   Other Other (See Comments)    Fresh fruit - makes throat itch, canned does not   Medications:  Current Outpatient Medications:    amLODipine (NORVASC) 5 MG tablet, Take 1 tablet by mouth once daily, Disp: 90 tablet, Rfl: 0   aspirin EC 81 MG tablet, Take 1 tablet (81 mg total) by mouth daily., Disp: 90 tablet, Rfl: 3   losartan (COZAAR) 50 MG tablet, Take 1 tablet by mouth once daily, Disp: 90 tablet, Rfl: 0   rosuvastatin (CRESTOR) 40 MG tablet, Take 1 tablet (40 mg total) by mouth daily., Disp: 90 tablet, Rfl: 3   sildenafil  (REVATIO) 20 MG tablet, Take 1 tablet (20 mg total) by mouth as needed. Take 1-5 tabs as needed prior to intercourse, Disp: 30 tablet, Rfl: 11    Review of Systems  Constitutional: Negative.   HENT: Negative.    Eyes: Negative.   Respiratory: Negative.    Cardiovascular: Negative.   Gastrointestinal: Negative.   Endocrine: Negative.   Genitourinary: Negative.   Musculoskeletal:  Positive for arthralgias.  Skin: Negative.   Allergic/Immunologic: Negative.   Neurological: Negative.   Psychiatric/Behavioral: Negative.        Objective:   Physical Exam Constitutional:      Appearance: Normal appearance.  HENT:     Head: Normocephalic.     Nose: Nose normal.     Mouth/Throat:     Mouth: Mucous membranes are moist.  Eyes:     Pupils: Pupils are equal, round, and reactive to light.  Cardiovascular:     Rate and Rhythm: Normal rate and regular rhythm.     Heart sounds: Normal heart sounds.  Pulmonary:     Effort: Pulmonary effort is normal.     Breath sounds: Normal breath sounds.  Abdominal:     General: Abdomen is flat.  Musculoskeletal:     Left hand: No swelling, deformity or bony tenderness. Decreased range of motion.     Cervical back:  Normal range of motion.     Comments: Stiffness present in left hand, unable to fully close left hand into fist due to stiffness in 4th and 5th digit. No selling or deformity at joints. Distal circulation intact.   Neurological:     General: No focal deficit present.     Mental Status: He is alert.     Sensory: Sensation is intact.     Motor: Motor function is intact.     Coordination: Coordination is intact.     Gait: Gait is intact.     Comments: Negative Phalen's and Tinel sign. No pain with ulnar nerve tapping. Strength intact    Psychiatric:        Mood and Affect: Mood normal.       Assessment and Plan: 1. Erectile dysfunction, unspecified erectile dysfunction type - sildenafil (REVATIO) 20 MG tablet; Take 1 tablet (20 mg  total) by mouth as needed. Take 1-5 tabs as needed prior to intercourse  Dispense: 30 tablet; Refill: 11  2. Annual physical exam Reviewed labs, encouraged diet improvement. Will send education on carbohydrates RTC in 6 month for lab recheck earlier with any complaints   3. Hyperlipidemia, unspecified hyperlipidemia type Continue Crestor daily and 81mg  ASA  4. Pre-diabetes Discussed diet control and possible need for medication in future if A1C continued to trend upward.  Will try intermittent fasting, decreasing beer intake and switching to light beer with fewer carbs/sugar  Education sent on carbohydrate counting  Recheck A1c in 6 months  5. Hypertension, unspecified type Continue Norvasc and Losartan as prescribed       Follow Up Instructions: I discussed the assessment and treatment plan with the patient. The patient was provided an opportunity to ask questions and all were answered. The patient agreed with the plan and demonstrated an understanding of the instructions.  A copy of instructions were sent to the patient via MyChart unless otherwise noted below.    Time:  I spent 45 minutes with the patient via telehealth technology discussing the above problems/concerns.

## 2020-11-23 ENCOUNTER — Other Ambulatory Visit: Payer: Self-pay | Admitting: Nurse Practitioner

## 2020-11-23 DIAGNOSIS — E785 Hyperlipidemia, unspecified: Secondary | ICD-10-CM

## 2020-11-24 ENCOUNTER — Other Ambulatory Visit: Payer: Self-pay | Admitting: Nurse Practitioner

## 2020-11-24 ENCOUNTER — Other Ambulatory Visit: Payer: Self-pay

## 2020-11-24 ENCOUNTER — Ambulatory Visit: Payer: BC Managed Care – PPO | Admitting: Nurse Practitioner

## 2020-11-24 ENCOUNTER — Encounter: Payer: Self-pay | Admitting: Nurse Practitioner

## 2020-11-24 VITALS — BP 120/86 | HR 86 | Temp 97.6°F | Resp 16

## 2020-11-24 DIAGNOSIS — R42 Dizziness and giddiness: Secondary | ICD-10-CM

## 2020-11-24 DIAGNOSIS — H6121 Impacted cerumen, right ear: Secondary | ICD-10-CM

## 2020-11-24 DIAGNOSIS — H6501 Acute serous otitis media, right ear: Secondary | ICD-10-CM

## 2020-11-24 LAB — GLUCOSE, POCT (MANUAL RESULT ENTRY): POC Glucose: 128 mg/dl — AB (ref 70–99)

## 2020-11-24 MED ORDER — AMOXICILLIN 875 MG PO TABS: 875.0000 mg | ORAL_TABLET | Freq: Two times a day (BID) | ORAL | 0 refills | Status: AC

## 2020-11-24 NOTE — Progress Notes (Signed)
Subjective:    Patient ID: Micheal Holland, male    DOB: 1960/08/16, 60 y.o.   MRN: [id number]  HPI  60 year old male presenting two days of dizziness. The dizziness occurs when he stands up, is worse in the morning and at night. Resolves when he sits down.   He also had a right posterior headache yesterday that has since resolved.   Denies other symptoms.   Today's Vitals   11/24/20 0900  BP: 120/86  Pulse: 86  Resp: 16  Temp: 97.6 F (36.4 C)  TempSrc: Tympanic  SpO2: 96%   There is no height or weight on file to calculate BMI.    Review of Systems  Constitutional: Negative.   HENT: Negative.    Eyes: Negative.   Respiratory: Negative.    Cardiovascular: Negative.   Gastrointestinal: Negative.   Genitourinary: Negative.   Musculoskeletal: Negative.   Neurological:  Positive for dizziness.  Hematological: Negative.    Past Medical History:  Diagnosis Date   Allergy    Coronary artery calcification seen on CT scan    a. 01/2017 CT Cor Ca2+ = 42 (74th %'ile). Ca2+ noted in prox/mid LAD & LCX.   Diastolic dysfunction    a. 01/2017 Echo: EF 55-60%, Gr2 DD. Triv AI. Ao root 4.0cm (was 3.6 cm on CT however).   Family history of premature CAD    a. Father died of MI @ 75. Mother died of MI @ 75. Brother multiple strokes.   Hyperlipidemia    Hypertension 12/26/2017    Current Outpatient Medications  Medication Instructions   amLODipine (NORVASC) 5 MG tablet Take 1 tablet by mouth once daily   amoxicillin (AMOXIL) 875 mg, Oral, 2 times daily   aspirin EC 81 mg, Oral, Daily   losartan (COZAAR) 50 MG tablet Take 1 tablet by mouth once daily   rosuvastatin (CRESTOR) 40 MG tablet Take 1 tablet by mouth once daily   sildenafil (REVATIO) 20 mg, Oral, As needed, Take 1-5 tabs as needed prior to intercourse       Objective:   Physical Exam HENT:     Head: Normocephalic.     Right Ear: Hearing normal. There is impacted cerumen. Tympanic membrane is erythematous.      Left Ear: Hearing normal.     Ears:     Comments: Post irrigation TM injected on the right     Mouth/Throat:     Mouth: Mucous membranes are moist.  Eyes:     Extraocular Movements: Extraocular movements intact.     Pupils: Pupils are equal, round, and reactive to light.  Pulmonary:     Effort: Pulmonary effort is normal.  Musculoskeletal:     Cervical back: Normal range of motion.  Skin:    General: Skin is warm.     Capillary Refill: Capillary refill takes less than 2 seconds.  Neurological:     General: No focal deficit present.     Mental Status: He is alert.     Cranial Nerves: Cranial nerves 2-12 are intact.     Sensory: Sensation is intact.     Motor: Motor function is intact. No weakness.     Coordination: Coordination is intact. Romberg sign negative. Coordination normal. Heel to Shin Test normal.     Gait: Gait is intact.          Assessment & Plan:   1. Non-recurrent acute serous otitis media of right ear  - amoxicillin (AMOXIL) 875 MG tablet; Take  1 tablet (875 mg total) by mouth 2 (two) times daily for 10 days.  Dispense: 20 tablet; Refill: 0  2. Impacted cerumen of right ear  - EAR CERUMEN REMOVAL- cerumen removed from right ear, TM erythematous   3. Lightheadedness  - POCT Glucose (CBG) 128 fasting   MRI was ordered from Neuro at appointment last March. Patient states that insurance denies coverage so he has not scheduled. Will follow up with MRI given family history would recommend MRI of brain  RTC if symptoms persist or with new concerns as discussed

## 2020-12-04 ENCOUNTER — Other Ambulatory Visit: Payer: Self-pay | Admitting: Nurse Practitioner

## 2020-12-04 DIAGNOSIS — I10 Essential (primary) hypertension: Secondary | ICD-10-CM

## 2020-12-07 ENCOUNTER — Other Ambulatory Visit: Payer: Self-pay

## 2020-12-07 ENCOUNTER — Ambulatory Visit
Admission: RE | Admit: 2020-12-07 | Discharge: 2020-12-07 | Disposition: A | Discharge status: Home / Self Care | Payer: BC Managed Care – PPO | Source: Ambulatory Visit | Attending: Neurology | Admitting: Neurology

## 2020-12-07 DIAGNOSIS — R200 Anesthesia of skin: Secondary | ICD-10-CM | POA: Diagnosis not present

## 2020-12-10 ENCOUNTER — Telehealth: Payer: Self-pay

## 2020-12-10 DIAGNOSIS — R200 Anesthesia of skin: Secondary | ICD-10-CM

## 2020-12-10 NOTE — Telephone Encounter (Signed)
Pt advised Of Mri of Brain results.  Mri Cervical spine With and without contrast ordered and sent to [place].

## 2020-12-10 NOTE — Telephone Encounter (Signed)
-----   Message from Pieter Partridge, DO sent at 12/07/2020  8:50 AM EST ----- MRI of brain shows mild nonspecific findings often seen in people with history of high blood pressure and high cholesterol.  It reveals no cause for the left sided numbness.  Would recommend MRI of cervical spine with and without contrast for left sided numbness and weakness.

## 2021-01-04 ENCOUNTER — Other Ambulatory Visit: Payer: Self-pay | Admitting: Nurse Practitioner

## 2021-01-04 DIAGNOSIS — I10 Essential (primary) hypertension: Secondary | ICD-10-CM

## 2021-01-05 ENCOUNTER — Ambulatory Visit
Admission: RE | Admit: 2021-01-05 | Discharge: 2021-01-05 | Disposition: A | Discharge status: Home / Self Care | Payer: BC Managed Care – PPO | Source: Ambulatory Visit | Attending: Neurology | Admitting: Neurology

## 2021-01-05 ENCOUNTER — Other Ambulatory Visit: Payer: Self-pay

## 2021-01-05 DIAGNOSIS — M50222 Other cervical disc displacement at C5-C6 level: Secondary | ICD-10-CM | POA: Diagnosis not present

## 2021-01-05 DIAGNOSIS — M4802 Spinal stenosis, cervical region: Secondary | ICD-10-CM | POA: Diagnosis not present

## 2021-01-05 DIAGNOSIS — M5021 Other cervical disc displacement,  high cervical region: Secondary | ICD-10-CM | POA: Diagnosis not present

## 2021-01-05 DIAGNOSIS — R200 Anesthesia of skin: Secondary | ICD-10-CM

## 2021-01-05 MED ORDER — GADOBENATE DIMEGLUMINE 529 MG/ML IV SOLN
18.0000 mL | Freq: Once | INTRAVENOUS | Status: AC | PRN
  Administered 2021-01-05: 10:00:00 18 mL via INTRAVENOUS

## 2021-01-06 ENCOUNTER — Other Ambulatory Visit: Payer: Self-pay

## 2021-01-06 DIAGNOSIS — R200 Anesthesia of skin: Secondary | ICD-10-CM

## 2021-01-06 NOTE — Progress Notes (Signed)
Error

## 2021-02-28 ENCOUNTER — Other Ambulatory Visit: Payer: Self-pay | Admitting: Medical

## 2021-02-28 ENCOUNTER — Other Ambulatory Visit: Payer: Self-pay

## 2021-02-28 DIAGNOSIS — Z0000 Encounter for general adult medical examination without abnormal findings: Secondary | ICD-10-CM

## 2021-02-28 DIAGNOSIS — E785 Hyperlipidemia, unspecified: Secondary | ICD-10-CM

## 2021-02-28 DIAGNOSIS — I10 Essential (primary) hypertension: Secondary | ICD-10-CM

## 2021-02-28 MED ORDER — ROSUVASTATIN CALCIUM 40 MG PO TABS: 40.0000 mg | ORAL_TABLET | Freq: Every day | ORAL | 0 refills | Status: DC

## 2021-02-28 MED ORDER — AMLODIPINE BESYLATE 5 MG PO TABS: 5.0000 mg | ORAL_TABLET | Freq: Every day | ORAL | 0 refills | Status: DC

## 2021-02-28 NOTE — Progress Notes (Signed)
Schedule annual exam with Apolonio Schneiders NP after labs have resulted.  Refilled patients crestor and amlodipine.

## 2021-03-01 ENCOUNTER — Other Ambulatory Visit: Payer: Self-pay

## 2021-03-01 ENCOUNTER — Ambulatory Visit: Payer: BC Managed Care – PPO | Admitting: Medical

## 2021-03-01 ENCOUNTER — Encounter: Payer: Self-pay | Admitting: Medical

## 2021-03-01 VITALS — BP 110/80 | HR 80 | Ht 74.5 in | Wt 208.0 lb

## 2021-03-01 DIAGNOSIS — E782 Mixed hyperlipidemia: Secondary | ICD-10-CM

## 2021-03-01 DIAGNOSIS — I10 Essential (primary) hypertension: Secondary | ICD-10-CM | POA: Diagnosis not present

## 2021-03-01 DIAGNOSIS — R931 Abnormal findings on diagnostic imaging of heart and coronary circulation: Secondary | ICD-10-CM | POA: Diagnosis not present

## 2021-03-01 NOTE — Progress Notes (Signed)
Cardiology Office Note:    Date:  03/01/2021   ID:  Micheal Holland, DOB [birth year], MRN [id number]  PCP:  Apolonio Schneiders, FNP  [facility] Cardiologist:  Kathlyn Sacramento, MD  [facility] Electrophysiologist:  None   Referring MD: Apolonio Schneiders, FNP   Chief Complaint: 12 month follow-up  History of Present Illness:    Micheal Holland is a 61 y.o. male with a hx of history of coronary calcium, diastolic dysfunction, hypertension, hyperlipidemia, and family history of premature CAD, who presents for follow-up.  In the setting of significant family history, he underwent cardiac CT for coronary calcium in January 2019 with a calcium score 42 (74th percentile).  Calcium was noted in the proximal and mid LAD and left circumflex.  Echocardiogram performed the same month, showed normal LV function with grade 2 diastolic dysfunction.  He has been managed with aspirin, statin, and antihypertensive therapy.   Last seen 05/08/19 and was overall doing well. BP a little high. LDL 99.   Today, the patient reports he is overall doing well, no concerns today. He has been following with PCP. He denies chest pain, shortness of breath, LLE, orthopnea, pnd, lightheadedness, dizziness, or palpitations. Patient does no formal activity. Work is physical, does a lot lifting. Diet is eating out a lot. BP and HR good. EKG stable. No issues with medications. He will get labs tomorrow.   Past Medical History:  Diagnosis Date   Allergy    Coronary artery calcification seen on CT scan    a. 01/2017 CT Cor Ca2+ = 42 (74th %'ile). Ca2+ noted in prox/mid LAD & LCX.   Diastolic dysfunction    a. 01/2017 Echo: EF 55-60%, Gr2 DD. Triv AI. Ao root 4.0cm (was 3.6 cm on CT however).   Family history of premature CAD    a. Father died of MI @ 41. Mother died of MI @ 12. Brother multiple strokes.   Hyperlipidemia    Hypertension 12/26/2017    Past Surgical History:  Procedure Laterality Date   COLONOSCOPY WITH  PROPOFOL N/A 04/03/2016   Procedure: COLONOSCOPY WITH PROPOFOL;  Surgeon: Lollie Sails, MD;  [facility]COPY;  Service: Endoscopy;  Laterality: N/A;   none      Current Medications: Current Meds  Medication Sig   amLODipine (NORVASC) 5 MG tablet Take 1 tablet (5 mg total) by mouth daily.   aspirin EC 81 MG tablet Take 81 mg by mouth. Takes 1 tablet PO occasionally Swallow whole.   losartan (COZAAR) 50 MG tablet Take 1 tablet by mouth once daily   rosuvastatin (CRESTOR) 40 MG tablet Take 1 tablet (40 mg total) by mouth daily.   sildenafil (REVATIO) 20 MG tablet Take 1 tablet (20 mg total) by mouth as needed. Take 1-5 tabs as needed prior to intercourse     Allergies:   Other   Social History   Socioeconomic History   Marital status: Single    Spouse name: Not on file   Number of children: Not on file   Years of education: Not on file   Highest education level: Not on file  Occupational History   Not on file  Tobacco Use   Smoking status: Never   Smokeless tobacco: Never  Vaping Use   Vaping Use: Never used  Substance and Sexual Activity   Alcohol use: Yes    Alcohol/week: 4.0 standard drinks    Types: 4 Cans of beer per week    Comment: Beer daily 1-  40 oz   Drug use: No   Sexual activity: Yes    Birth control/protection: None  Other Topics Concern   Not on file  Social History Narrative   Left handed   [place]lex apartment   Social Determinants of Health   Financial Resource Strain: Not on file  Food Insecurity: Not on file  Transportation Needs: Not on file  Physical Activity: Not on file  Stress: Not on file  Social Connections: Not on file     Family History: The patient's family history includes Heart Problems in his sister; Heart attack in his brother, father, and mother; Heart disease in his brother, father, and mother; Hypertension in his father and mother; Kidney disease in his sister.  ROS:   Please see the history of present illness.      All other systems reviewed and are negative.  EKGs/Labs/Other Studies Reviewed:    The following studies were reviewed today:  Echo 2019 Study Conclusions   - Left ventricle: The cavity size was normal. Systolic function was    normal. The estimated ejection fraction was in the range of 55%    to 60%. Wall motion was normal; there were no regional wall    motion abnormalities. Features are consistent with a pseudonormal    left ventricular filling pattern, with concomitant abnormal    relaxation and increased filling pressure (grade 2 diastolic    dysfunction).  - Aortic valve: There was trivial regurgitation.  - Aortic root: The aortic root was mild to moderately dilated, 4.0    cm  - Ascending aorta: The ascending aorta was mildly dilated. 3.5 cm  - Left atrium: The atrium was normal in size.  - Right ventricle: Systolic function was normal.  - Pulmonary arteries: Systolic pressure was within the normal    range.   Cardiac scoring 2019 IMPRESSION: Coronary calcium score 52 of . This was 90 th percentile for age and sex matched control.   Jenkins Rouge     Electronically Signed   By: Jenkins Rouge M.D.   On: 01/15/2017 12:03   EKG:  EKG is  ordered today.  The ekg ordered today demonstrates NSR, 80bpm, nonspecific T wave changes, no significant changes  Recent Labs: 09/07/2020: ALT 25; BUN 13; Creatinine, Ser 0.97; Hemoglobin 14.0; Platelets 273; Potassium 4.0; Sodium 141; TSH 2.980  Recent Lipid Panel    Component Value Date/Time   CHOL 149 09/07/2020 1005   TRIG 61 09/07/2020 1005   HDL 71 09/07/2020 1005   CHOLHDL 2.1 09/07/2020 1005   LDLCALC 65 09/07/2020 1005     Physical Exam:    VS:  BP 110/80 (BP Location: Left Arm, Patient Position: Sitting, Cuff Size: Large)    Pulse 80    Ht 6' 2.5" (1.892 m)    Wt 208 lb (94.3 kg)    SpO2 98%    BMI 26.35 kg/m     Wt Readings from Last 3 Encounters:  03/01/21 208 lb (94.3 kg)  03/17/20 221 lb 12.8 oz (100.6 kg)   03/02/20 223 lb (101.2 kg)     GEN:  Well nourished, well developed in no acute distress HEENT: Normal NECK: No JVD; No carotid bruits LYMPHATICS: No lymphadenopathy CARDIAC: RRR, no murmurs, rubs, gallops RESPIRATORY:  Clear to auscultation without rales, wheezing or rhonchi  ABDOMEN: Soft, non-tender, non-distended MUSCULOSKELETAL:  No edema; No deformity  SKIN: Warm and dry NEUROLOGIC:  Alert and oriented x 3 PSYCHIATRIC:  Normal affect   ASSESSMENT:  1. Elevated coronary artery calcium score   2. Essential hypertension   3. Hyperlipidemia, mixed    PLAN:    In order of problems listed above:  Abnormal coronary calcium score Calcium score of 52, 74th percentile for age and sex matched control in 01/2017. Patient denies anginal symptoms. EKG with no ischemic changes. No further ischemic work-up indicated. Continue Aspirin and statin.   HTN BP good today. Continue amlodipine 5mg  daily and Losartan 50mg  daily.Labs from August 2022 reviewed. PCP will get labs this year.   HLD LDL 65 in 08/2020. Continue Crestor.    Disposition: Follow up in 1 year(s) with MD/APP     Signed, Amberlyn Martinezgarcia Ninfa Meeker, PA-C  03/01/2021 8:03 AM     [facility]

## 2021-03-01 NOTE — Patient Instructions (Signed)

## 2021-03-02 ENCOUNTER — Other Ambulatory Visit: Payer: BC Managed Care – PPO

## 2021-03-02 DIAGNOSIS — Z0000 Encounter for general adult medical examination without abnormal findings: Secondary | ICD-10-CM

## 2021-03-03 ENCOUNTER — Other Ambulatory Visit: Payer: Self-pay | Admitting: Nurse Practitioner

## 2021-03-03 DIAGNOSIS — E559 Vitamin D deficiency, unspecified: Secondary | ICD-10-CM

## 2021-03-03 LAB — CMP12+LP+TP+TSH+6AC+PSA+CBC…
ALT: 27 IU/L (ref 0–44)
AST: 28 IU/L (ref 0–40)
Albumin/Globulin Ratio: 2.2 (ref 1.2–2.2)
Albumin: 4.8 g/dL (ref 3.8–4.9)
Alkaline Phosphatase: 72 IU/L (ref 44–121)
BUN/Creatinine Ratio: 10 (ref 9–20)
BUN: 10 mg/dL (ref 6–24)
Basophils Absolute: 0 10*3/uL (ref 0.0–0.2)
Basos: 1 %
Bilirubin Total: 0.9 mg/dL (ref 0.0–1.2)
Calcium: 9.6 mg/dL (ref 8.7–10.2)
Chloride: 108 mmol/L — ABNORMAL HIGH (ref 96–106)
Chol/HDL Ratio: 1.8 ratio (ref 0.0–5.0)
Cholesterol, Total: 159 mg/dL (ref 100–199)
Creatinine, Ser: 1.01 mg/dL (ref 0.76–1.27)
EOS (ABSOLUTE): 0.1 10*3/uL (ref 0.0–0.4)
Eos: 3 %
Estimated CHD Risk: <0.5 times avg. (ref 0.0–1.0)
Free Thyroxine Index: 1.3 (ref 1.2–4.9)
GGT: 28 IU/L (ref 0–65)
Globulin, Total: 2.2 g/dL (ref 1.5–4.5)
Glucose: 90 mg/dL (ref 70–99)
HDL: 90 mg/dL (ref >39)
Hematocrit: 44.8 % (ref 37.5–51.0)
Hemoglobin: 14.6 g/dL (ref 13.0–17.7)
Immature Grans (Abs): 0 10*3/uL (ref 0.0–0.1)
Immature Granulocytes: 0 %
Iron: 97 ug/dL (ref 38–169)
LDH: 195 IU/L (ref 121–224)
LDL Chol Calc (NIH): 57 mg/dL (ref 0–99)
Lymphocytes Absolute: 1.5 10*3/uL (ref 0.7–3.1)
Lymphs: 37 %
MCH: 29.1 pg (ref 26.6–33.0)
MCHC: 32.6 g/dL (ref 31.5–35.7)
MCV: 89 fL (ref 79–97)
Monocytes Absolute: 0.4 10*3/uL (ref 0.1–0.9)
Monocytes: 10 %
Neutrophils Absolute: 1.9 10*3/uL (ref 1.4–7.0)
Neutrophils: 49 %
Phosphorus: 3.4 mg/dL (ref 2.8–4.1)
Platelets: 304 10*3/uL (ref 150–450)
Potassium: 4.6 mmol/L (ref 3.5–5.2)
Prostate Specific Ag, Serum: 0.5 ng/mL (ref 0.0–4.0)
RBC: 5.02 x10E6/uL (ref 4.14–5.80)
RDW: 14.3 % (ref 11.6–15.4)
Sodium: 143 mmol/L (ref 134–144)
T3 Uptake Ratio: 25 % (ref 24–39)
T4, Total: 5.2 ug/dL (ref 4.5–12.0)
TSH: 1.96 u[IU]/mL (ref 0.450–4.500)
Total Protein: 7 g/dL (ref 6.0–8.5)
Triglycerides: 59 mg/dL (ref 0–149)
Uric Acid: 4.8 mg/dL (ref 3.8–8.4)
VLDL Cholesterol Cal: 12 mg/dL (ref 5–40)
WBC: 3.9 10*3/uL (ref 3.4–10.8)
eGFR: 86 mL/min/{1.73_m2} (ref >59)

## 2021-03-03 LAB — HGB A1C W/O EAG: Hgb A1c MFr Bld: 5.9 % — ABNORMAL HIGH (ref 4.8–5.6)

## 2021-03-03 LAB — VITAMIN D 25 HYDROXY (VIT D DEFICIENCY, FRACTURES): Vit D, 25-Hydroxy: 14.2 ng/mL — ABNORMAL LOW (ref 30.0–100.0)

## 2021-03-03 MED ORDER — VITAMIN D3 125 MCG (5000 UT) PO CAPS: 5000.0000 [IU] | ORAL_CAPSULE | Freq: Every day | ORAL | 1 refills | Status: AC

## 2021-03-03 NOTE — Progress Notes (Signed)
Called patient to review labs °Started vitamin D Rx  ° °Reviewed cardiology notes ° °RTC in 6 months for labs and physical earlier with any other concerns.  ° °Recent Results (from the past 2160 hour(s))  °VITAMIN D 25 Hydroxy (Vit-D Deficiency, Fractures)     Status: Abnormal  ° Collection Time: 03/02/21  8:45 AM  °Result Value Ref Range  ° Vit D, 25-Hydroxy 14.2 (L) 30.0 - 100.0 ng/mL  °  Comment: Vitamin D deficiency has been defined by the Institute of °Medicine and an Endocrine Society practice guideline as a °level of serum 25-OH vitamin D less than 20 ng/mL (1,2). °The Endocrine Society went on to further define vitamin D °insufficiency as a level between 21 and 29 ng/mL (2). °1. IOM (Institute of Medicine). 2010. Dietary reference °   intakes for calcium and D. [place]: The °   National Academies Press. °2. Holick MF, Binkley Turlock, Bischoff-Ferrari HA, et al. °   Evaluation, treatment, and prevention of vitamin D °   deficiency: an Endocrine Society clinical practice °   guideline. JCEM. 2011 Jul; 96(7):1911-30. °  °Hgb A1c w/o eAG     Status: Abnormal  ° Collection Time: 03/02/21  8:45 AM  °Result Value Ref Range  ° Hgb A1c MFr Bld 5.9 (H) 4.8 - 5.6 %  °  Comment:          Prediabetes: 5.7 - 6.4 °         Diabetes: >6.4 °         Glycemic control for adults with diabetes: <7.0 °  °CMP12+LP+TP+TSH+6AC+PSA+CBC…     Status: Abnormal  ° Collection Time: 03/02/21  8:45 AM  °Result Value Ref Range  ° Glucose 90 70 - 99 mg/dL  ° Uric Acid 4.8 3.8 - 8.4 mg/dL  °  Comment:            Therapeutic target for gout patients: <6.0  ° BUN 10 6 - 24 mg/dL  ° Creatinine, Ser 1.01 0.76 - 1.27 mg/dL  ° eGFR 86 >59 mL/min/1.73  ° BUN/Creatinine Ratio 10 9 - 20  ° Sodium 143 134 - 144 mmol/L  ° Potassium 4.6 3.5 - 5.2 mmol/L  ° Chloride 108 (H) 96 - 106 mmol/L  ° Calcium 9.6 8.7 - 10.2 mg/dL  ° Phosphorus 3.4 2.8 - 4.1 mg/dL  ° Total Protein 7.0 6.0 - 8.5 g/dL  ° Albumin 4.8 3.8 - 4.9 g/dL  ° Globulin, Total 2.2 1.5 - 4.5  g/dL  ° Albumin/Globulin Ratio 2.2 1.2 - 2.2  ° Bilirubin Total 0.9 0.0 - 1.2 mg/dL  ° Alkaline Phosphatase 72 44 - 121 IU/L  ° LDH 195 121 - 224 IU/L  ° AST 28 0 - 40 IU/L  ° ALT 27 0 - 44 IU/L  ° GGT 28 0 - 65 IU/L  ° Iron 97 38 - 169 ug/dL  ° Cholesterol, Total 159 100 - 199 mg/dL  ° Triglycerides 59 0 - 149 mg/dL  ° HDL 90 >39 mg/dL  ° VLDL Cholesterol Cal 12 5 - 40 mg/dL  ° LDL Chol Calc (NIH) 57 0 - 99 mg/dL  ° Chol/HDL Ratio 1.8 0.0 - 5.0 ratio  °  Comment:                                   T. Chol/HDL Ratio °                                              Men  Women                               1/2 Avg.Risk  3.4    3.3                                   Avg.Risk  5.0    4.4                                2X Avg.Risk  9.6    7.1                                3X Avg.Risk 23.4   11.0    Estimated CHD Risk  < 0.5 0.0 - 1.0 times avg.    Comment: The CHD Risk is based on the T. Chol/HDL ratio. Other factors affect CHD Risk such as hypertension, smoking, diabetes, severe obesity, and family history of premature CHD.    TSH 1.960 0.450 - 4.500 uIU/mL   T4, Total 5.2 4.5 - 12.0 ug/dL   T3 Uptake Ratio 25 24 - 39 %   Free Thyroxine Index 1.3 1.2 - 4.9   Prostate Specific Ag, Serum 0.5 0.0 - 4.0 ng/mL    Comment: Roche ECLIA methodology. According to the American Urological Association, Serum PSA should decrease and remain at undetectable levels after radical prostatectomy. The AUA defines biochemical recurrence as an initial PSA value 0.2 ng/mL or greater followed by a subsequent confirmatory PSA value 0.2 ng/mL or greater. Values obtained with different assay methods or kits cannot be used interchangeably. Results cannot be interpreted as absolute evidence of the presence or absence of malignant disease.    WBC 3.9 3.4 - 10.8 x10E3/uL   RBC 5.02 4.14 - 5.80 x10E6/uL   Hemoglobin 14.6 13.0 - 17.7 g/dL   Hematocrit 44.8 37.5 - 51.0 %   MCV 89 79 - 97 fL   MCH 29.1 26.6 - 33.0 pg   MCHC  32.6 31.5 - 35.7 g/dL   RDW 14.3 11.6 - 15.4 %   Platelets 304 150 - 450 x10E3/uL   Neutrophils 49 Not Estab. %   Lymphs 37 Not Estab. %   Monocytes 10 Not Estab. %   Eos 3 Not Estab. %   Basos 1 Not Estab. %   Neutrophils Absolute 1.9 1.4 - 7.0 x10E3/uL   Lymphocytes Absolute 1.5 0.7 - 3.1 x10E3/uL   Monocytes Absolute 0.4 0.1 - 0.9 x10E3/uL   EOS (ABSOLUTE) 0.1 0.0 - 0.4 x10E3/uL   Basophils Absolute 0.0 0.0 - 0.2 x10E3/uL   Immature Granulocytes 0 Not Estab. %   Immature Grans (Abs) 0.0 0.0 - 0.1 x10E3/uL

## 2021-03-28 ENCOUNTER — Other Ambulatory Visit: Payer: Self-pay | Admitting: Urology

## 2021-03-28 DIAGNOSIS — N529 Male erectile dysfunction, unspecified: Secondary | ICD-10-CM

## 2021-03-29 ENCOUNTER — Other Ambulatory Visit: Payer: Self-pay | Admitting: Nurse Practitioner

## 2021-03-29 DIAGNOSIS — N529 Male erectile dysfunction, unspecified: Secondary | ICD-10-CM

## 2021-04-06 ENCOUNTER — Other Ambulatory Visit: Payer: Self-pay | Admitting: Nurse Practitioner

## 2021-04-06 DIAGNOSIS — I10 Essential (primary) hypertension: Secondary | ICD-10-CM

## 2021-05-23 IMAGING — CR LUMBAR SPINE - 2-3 VIEW
1 series · 3 of 3 positions shown · non-contrast
Comparison: None.

CLINICAL DATA: Lumbago

EXAM:
LUMBAR SPINE - 2-3 VIEW

[Series 1: dg lumbar spine 2-3 views · 0.14mm/px · 3 of 3 slices shown]
[im 1/3]
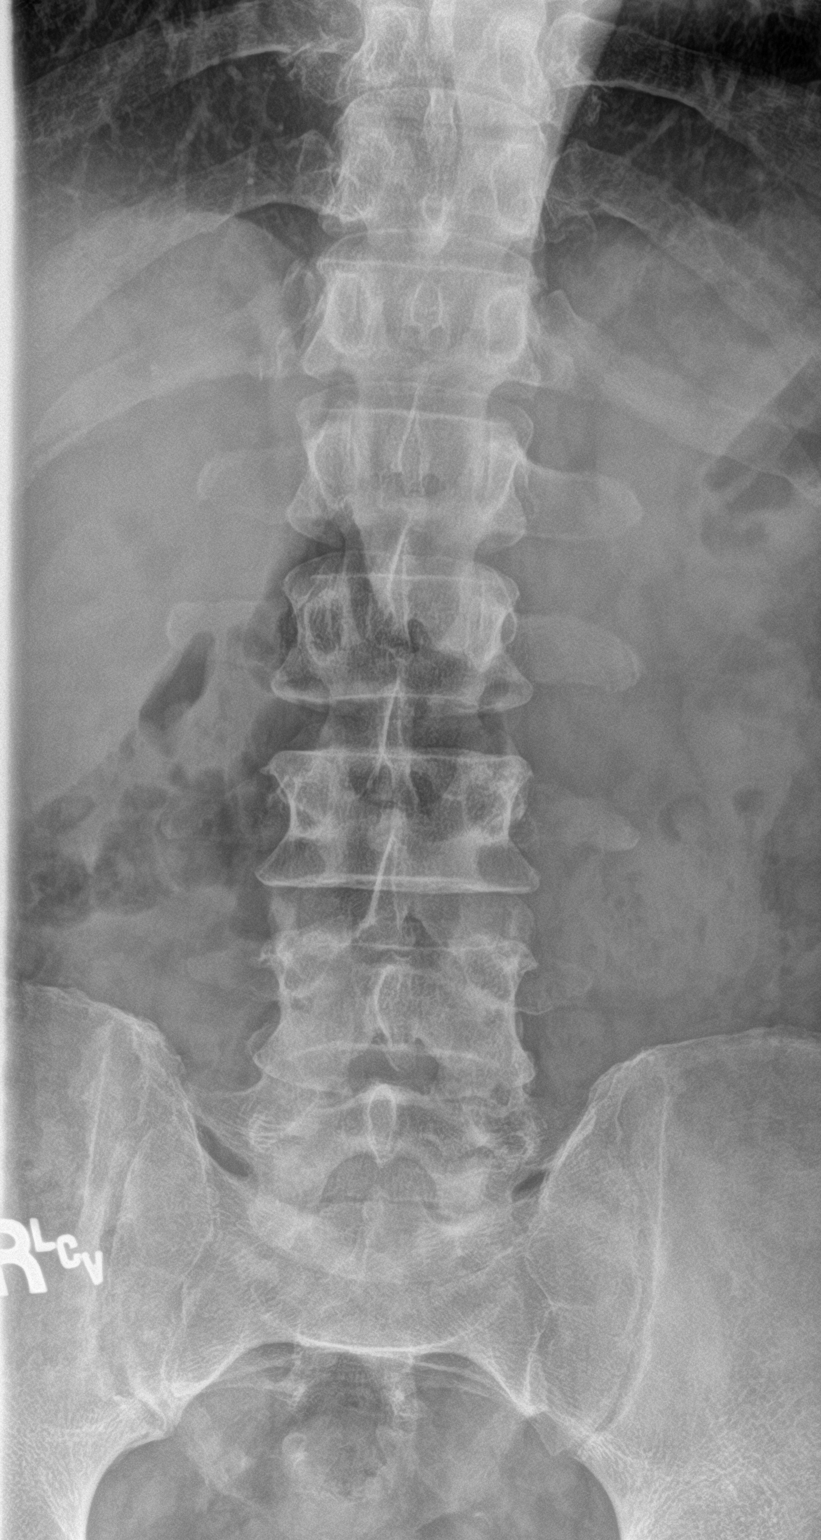
[im 2/3]
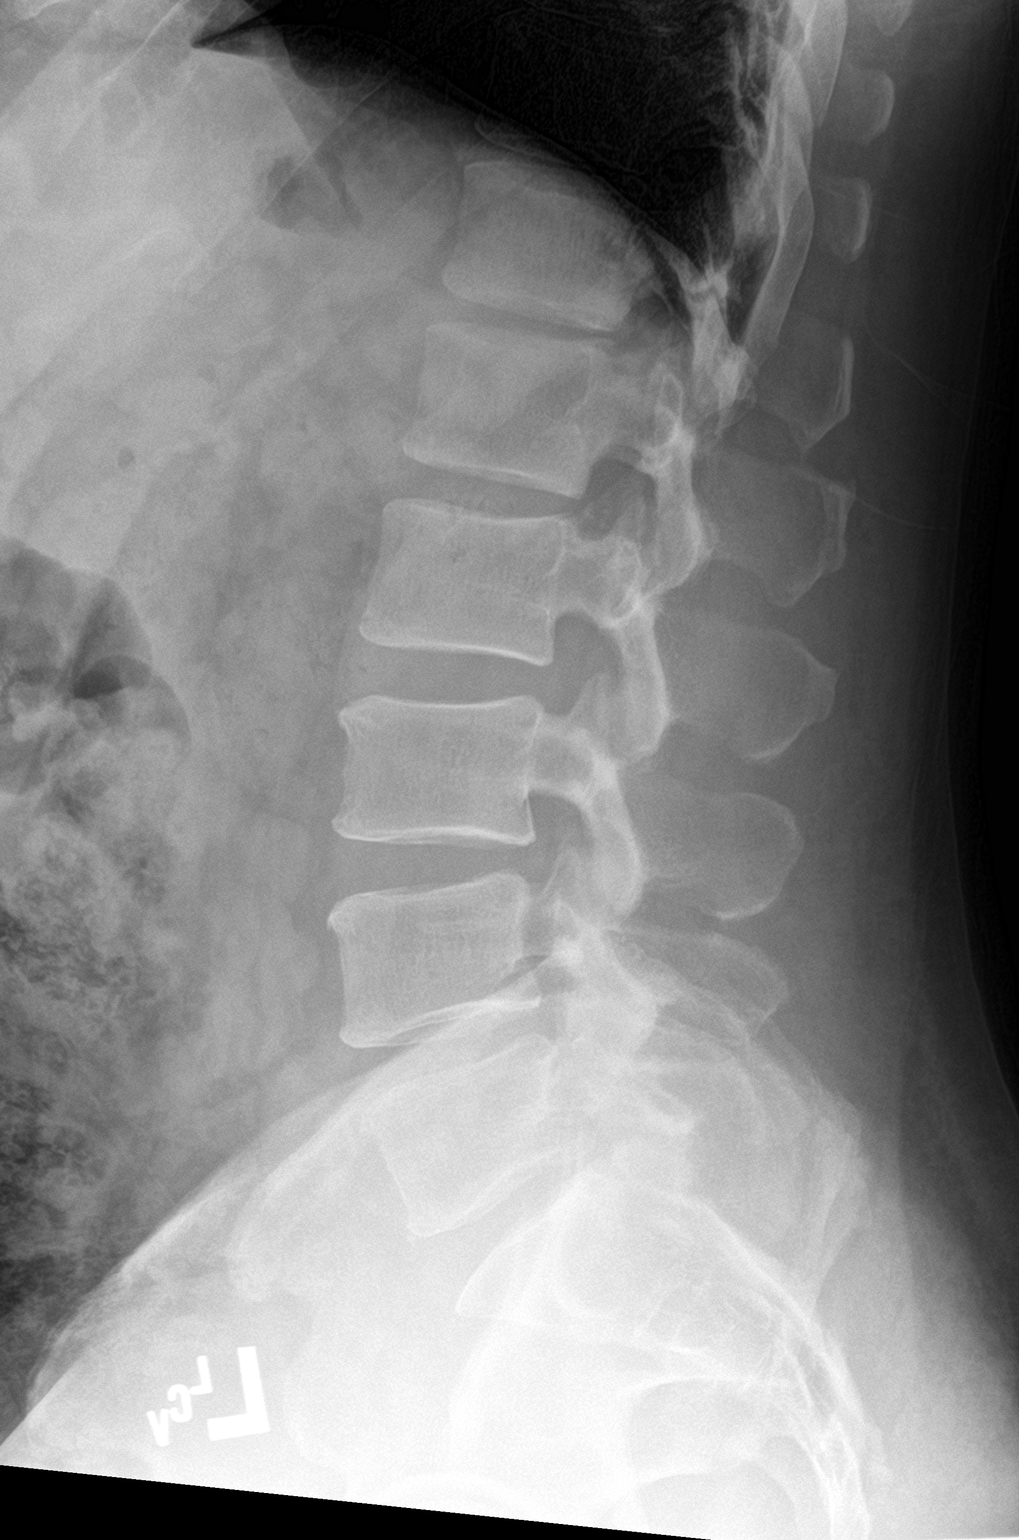
[im 3/3]
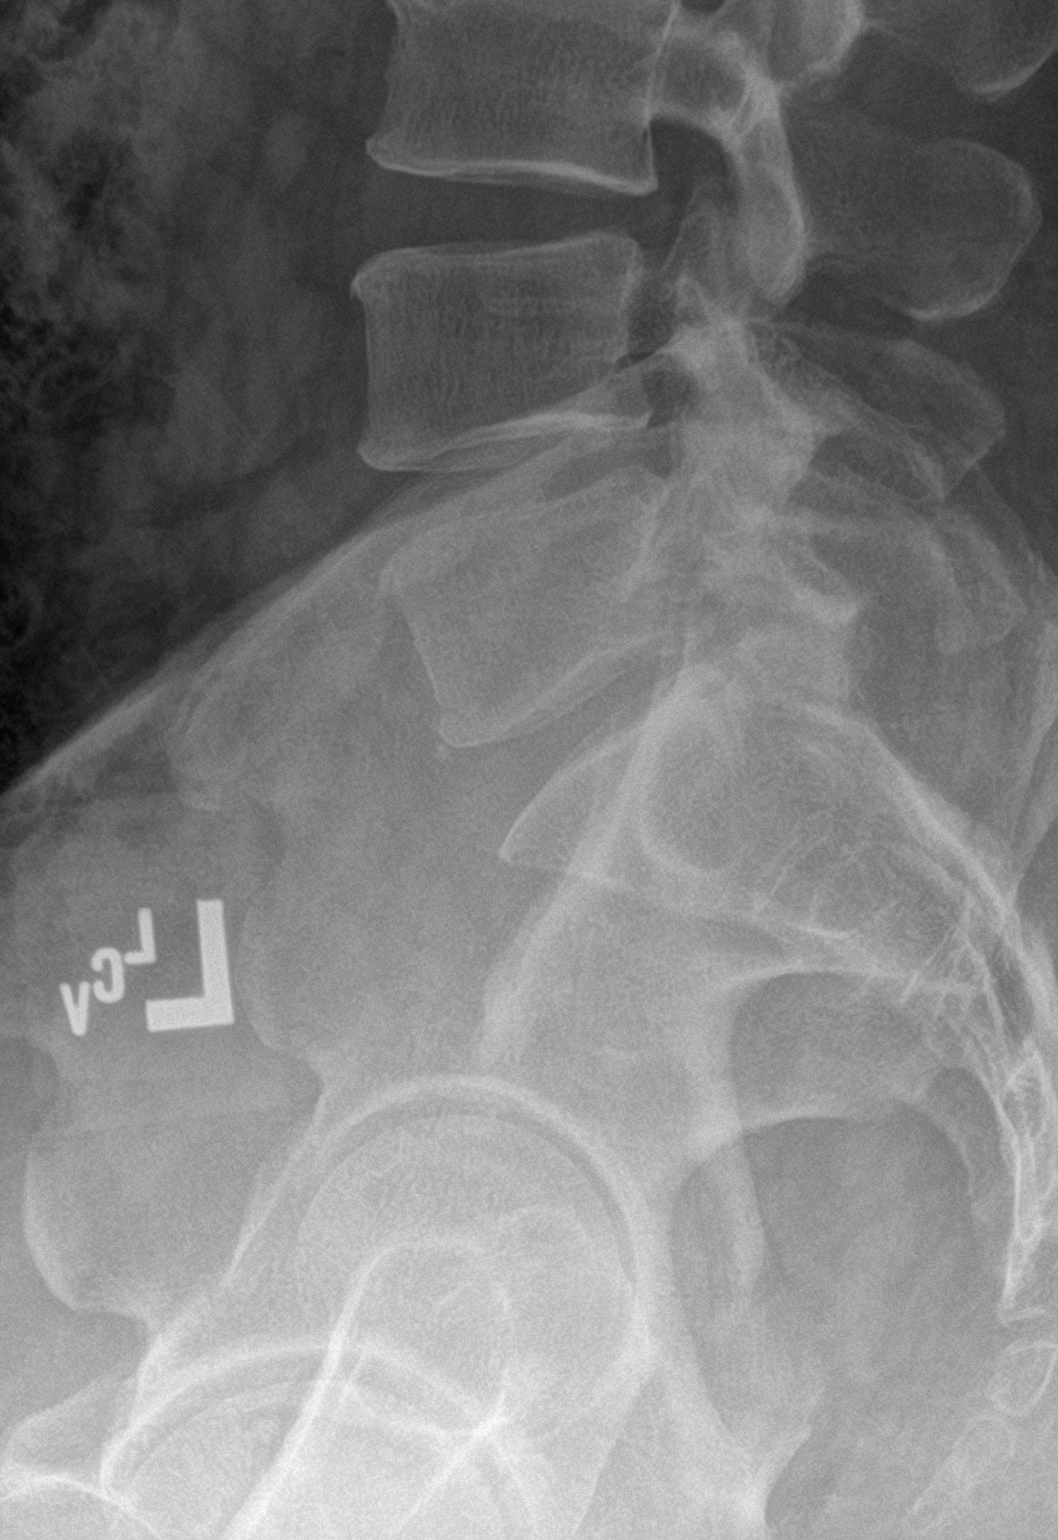

[3 of 3 positions shown; findings below may reference images not displayed]

FINDINGS: Frontal, lateral, and spot lumbosacral lateral images were obtained.
There are 5 non-rib-bearing lumbar type vertebral bodies. There is
no fracture or spondylolisthesis. Disc spaces appear unremarkable.
No erosive change.
IMPRESSION: No fracture or spondylolisthesis. No appreciable arthropathic
change.

## 2021-05-26 ENCOUNTER — Other Ambulatory Visit: Payer: Self-pay | Admitting: Medical

## 2021-05-26 DIAGNOSIS — E785 Hyperlipidemia, unspecified: Secondary | ICD-10-CM

## 2021-06-07 ENCOUNTER — Other Ambulatory Visit: Payer: Self-pay | Admitting: Medical

## 2021-06-07 DIAGNOSIS — I10 Essential (primary) hypertension: Secondary | ICD-10-CM

## 2021-06-30 ENCOUNTER — Other Ambulatory Visit: Payer: Self-pay | Admitting: Nurse Practitioner

## 2021-06-30 DIAGNOSIS — I10 Essential (primary) hypertension: Secondary | ICD-10-CM

## 2021-08-15 ENCOUNTER — Ambulatory Visit (INDEPENDENT_AMBULATORY_CARE_PROVIDER_SITE_OTHER): Payer: Self-pay | Admitting: Nurse Practitioner

## 2021-08-15 DIAGNOSIS — L089 Local infection of the skin and subcutaneous tissue, unspecified: Secondary | ICD-10-CM

## 2021-08-15 MED ORDER — CEPHALEXIN 500 MG PO CAPS: 500.0000 mg | ORAL_CAPSULE | Freq: Four times a day (QID) | ORAL | 0 refills | Status: AC

## 2021-08-15 NOTE — Progress Notes (Signed)
   Subjective:    Patient ID: Micheal Holland, male    DOB: [birth year], 61 y.o.   MRN: [id number]  HPI  61 year old male with unhealing wound between 2nd and 3rd toes. He has been using topical antibiotic ointment to the area. Area does hurt at times. Denies known wound.   Denies systemic symptoms  He is pre diabetic   No fever   Review of Systems  Constitutional: Negative.   HENT: Negative.    Respiratory: Negative.    Genitourinary: Negative.   Skin:  Positive for wound.  Neurological: Negative.        Objective:   Physical Exam HENT:     Head: Normocephalic.  Pulmonary:     Effort: Pulmonary effort is normal.  Musculoskeletal:       Feet:     Comments: Breakdown of skin with ulceration between toes as marked. Tender to touch. Surrounding skin WNL. Distal circulation and sensation intact.   Skin:    General: Skin is warm.  Neurological:     Mental Status: He is alert.           Assessment & Plan:  1. Skin infection Skin cleaned, dressed with topical abx ointment and non stick gauze. Wrapped to secure.  RTC tomorrow for recheck and dressing change  Start: - cephALEXin (KEFLEX) 500 MG capsule; Take 1 capsule (500 mg total) by mouth 4 (four) times daily for 10 days.  Dispense: 40 capsule; Refill: 0

## 2021-08-16 ENCOUNTER — Ambulatory Visit (INDEPENDENT_AMBULATORY_CARE_PROVIDER_SITE_OTHER): Payer: Self-pay | Admitting: Nurse Practitioner

## 2021-08-16 ENCOUNTER — Encounter: Payer: Self-pay | Admitting: Nurse Practitioner

## 2021-08-16 VITALS — BP 132/86 | HR 77 | Temp 98.6°F | Ht 74.5 in | Wt 207.2 lb

## 2021-08-16 DIAGNOSIS — S91109D Unspecified open wound of unspecified toe(s) without damage to nail, subsequent encounter: Secondary | ICD-10-CM

## 2021-08-16 NOTE — Progress Notes (Signed)
   Subjective:    Patient ID: Micheal Holland, male    DOB: Feb 27, 1960, 61 y.o.   MRN: [id number]  HPI  61 year old male returning to clinic for toe check today. States it hurts to walk on that toe now. Started oral antibiotics.   Denies any systemic symptoms.   Today's Vitals   08/16/21 0824  BP: 132/86  Pulse: 77  Temp: 98.6 F (37 C)  TempSrc: Tympanic  SpO2: 100%  Weight: 207 lb 3.2 oz (94 kg)  Height: 6' 2.5" (1.892 m)   Body mass index is 26.25 kg/m.   Review of Systems  Constitutional: Negative.   HENT: Negative.    Respiratory: Negative.    Cardiovascular: Negative.   Genitourinary: Negative.   Skin:  Positive for wound.       Objective:   Physical Exam Pulmonary:     Effort: Pulmonary effort is normal.  Musculoskeletal:        General: Normal range of motion.     Right foot: Normal.     Left foot: Tenderness present.       Feet:     Comments: Dried ulceration between toes as noted   Neurological:     Mental Status: He is alert.           Assessment & Plan:  1. Open wound of toe, subsequent encounter Continue to use non stick guaze between toes Keep dry  Continue oral antibiotics  Monitor and return to clinic with any failure to improve or with new or worsening symptoms

## 2021-08-18 ENCOUNTER — Other Ambulatory Visit: Payer: Self-pay | Admitting: Nurse Practitioner

## 2021-08-18 DIAGNOSIS — E785 Hyperlipidemia, unspecified: Secondary | ICD-10-CM

## 2021-08-30 ENCOUNTER — Encounter: Payer: Self-pay | Admitting: Nurse Practitioner

## 2021-08-30 ENCOUNTER — Ambulatory Visit (INDEPENDENT_AMBULATORY_CARE_PROVIDER_SITE_OTHER): Payer: Self-pay | Admitting: Family

## 2021-08-30 VITALS — BP 118/62 | HR 73 | Temp 98.7°F

## 2021-08-30 DIAGNOSIS — L089 Local infection of the skin and subcutaneous tissue, unspecified: Secondary | ICD-10-CM

## 2021-08-30 NOTE — Progress Notes (Signed)
Micheal Holland, [place] [address]   Office Visit Note  Patient Name: Micheal Holland  [id number]  [id number]  Date of Service: 08/30/2021  Chief Complaint  Patient presents with   Follow-up    Follow up on left foot. Was giving antibiotic last week. Got better but doesn't like the way its healing.Still hurts     HPI Pt is "doing his best" to take his antibiotics as prescribed.  He feels his toe is having a difficult time healing because he is on his feet all day.  Using some neosporin and nonstick gauze on wound daily. Pt is scheduled for blood work in September.     Current Medication:  Outpatient Encounter Medications as of 08/30/2021  Medication Sig   amLODipine (NORVASC) 5 MG tablet Take 1 tablet by mouth once daily   aspirin EC 81 MG tablet Take 81 mg by mouth. Takes 1 tablet PO occasionally Swallow whole.   cephALEXin (KEFLEX) 500 MG capsule Take 500 mg by mouth 4 (four) times daily.   losartan (COZAAR) 50 MG tablet Take 1 tablet by mouth once daily   rosuvastatin (CRESTOR) 40 MG tablet Take 1 tablet by mouth once daily   sildenafil (REVATIO) 20 MG tablet TAKE 1 TO 5 TABLETS BY MOUTH AS NEEDED PRIOR TO INTERCOURSE   Cholecalciferol (VITAMIN D3) 125 MCG (5000 UT) CAPS Take 1 capsule (5,000 Units total) by mouth daily. (Patient not taking: Reported on 08/16/2021)   No facility-administered encounter medications on file as of 08/30/2021.      Medical History: Past Medical History:  Diagnosis Date   Allergy    Coronary artery calcification seen on CT scan    a. 01/2017 CT Cor Ca2+ = 42 (74th %'ile). Ca2+ noted in prox/mid LAD & LCX.   Diastolic dysfunction    a. 01/2017 Echo: EF 55-60%, Gr2 DD. Triv AI. Ao root 4.0cm (was 3.6 cm on CT however).   Family history of premature CAD    a. Father died of MI @ 91. Mother died of MI @ 69. Brother multiple strokes.   Hyperlipidemia    Hypertension 12/26/2017     Vital Signs: BP 118/62 (BP  Location: Left Arm, Patient Position: Sitting)   Pulse 73   Temp 98.7 F (37.1 C) (Tympanic)   SpO2 100%    Review of Systems  Constitutional:  Positive for activity change. Negative for chills, fatigue and fever.  Skin:  Positive for wound.    Physical Exam Constitutional:      Appearance: Normal appearance.  Pulmonary:     Effort: Pulmonary effort is normal.  Skin:    Comments: Two mirrored lesion between medial bases of left 2nd and 3rd toes.  No redness or erythema.  Both circular wounds have thin white coating confirming some healing.  Tender to palpation.  Neurological:     Mental Status: He is alert.       Assessment/Plan: Reviewed exam findings.  Cont to cover with neosporin and non stick gauze.  Discussed the importance of taking the antibiotic as prescribed.  Pt states that he "is doing the best he can."  He will continue the antibiotic until complete.  Pt given note to be out of work due to injury 08/30/21-08/31/21.  Rtc with any persistent concerns or in Sept for lab work to determine DM status.  General Counseling: Micheal Holland verbalizes understanding of the findings of todays visit and agrees with plan of treatment. I have discussed any further  diagnostic evaluation that may be needed or ordered today. We also reviewed his medications today. he has been encouraged to call the office with any questions or concerns that should arise related to todays visit.     Time spent: 10 Minutes  Wardell Honour, FNP-C

## 2021-08-31 ENCOUNTER — Other Ambulatory Visit: Payer: Self-pay | Admitting: Nurse Practitioner

## 2021-09-14 ENCOUNTER — Ambulatory Visit (INDEPENDENT_AMBULATORY_CARE_PROVIDER_SITE_OTHER): Payer: Self-pay | Admitting: Nurse Practitioner

## 2021-09-14 ENCOUNTER — Other Ambulatory Visit: Payer: Self-pay | Admitting: Nurse Practitioner

## 2021-09-14 DIAGNOSIS — B49 Unspecified mycosis: Secondary | ICD-10-CM

## 2021-09-14 DIAGNOSIS — I10 Essential (primary) hypertension: Secondary | ICD-10-CM

## 2021-09-14 MED ORDER — AMLODIPINE BESYLATE 5 MG PO TABS: 5.0000 mg | ORAL_TABLET | Freq: Every day | ORAL | 3 refills | Status: AC

## 2021-09-14 MED ORDER — NYSTATIN 100000 UNIT/GM EX CREA: 1.0000 | TOPICAL_CREAM | Freq: Two times a day (BID) | CUTANEOUS | 0 refills | Status: DC

## 2021-09-14 NOTE — Progress Notes (Signed)
[facility] [address], [place] [address] [phone]  Office Visit Note  Patient Name: Micheal Holland Date of Birth [id number]  Medical Record number [id number]  Date of Service: 09/14/2021  HPI 61 year old male presenting to [facility] with ongoing left foot pain. He was treated with a 10 day course of Kelfex recently for a wound between his toes. He has healed well but continues to have pain between his toes where they connect to foot.   Denies trauma to area  Has been using a cotton ball between his toes for relief   Current Medication:  Outpatient Encounter Medications as of 09/14/2021  Medication Sig   amLODipine (NORVASC) 5 MG tablet Take 1 tablet (5 mg total) by mouth daily.   aspirin EC 81 MG tablet Take 81 mg by mouth. Takes 1 tablet PO occasionally Swallow whole.   cephALEXin (KEFLEX) 500 MG capsule Take 500 mg by mouth 4 (four) times daily.   Cholecalciferol (VITAMIN D3) 125 MCG (5000 UT) CAPS Take 1 capsule (5,000 Units total) by mouth daily. (Patient not taking: Reported on 08/16/2021)   losartan (COZAAR) 50 MG tablet Take 1 tablet by mouth once daily   rosuvastatin (CRESTOR) 40 MG tablet Take 1 tablet by mouth once daily   sildenafil (REVATIO) 20 MG tablet TAKE 1 TO 5 TABLETS BY MOUTH AS NEEDED PRIOR TO INTERCOURSE   No facility-administered encounter medications on file as of 09/14/2021.      Medical History: Past Medical History:  Diagnosis Date   Allergy    Coronary artery calcification seen on CT scan    a. 01/2017 CT Cor Ca2+ = 42 (74th %'ile). Ca2+ noted in prox/mid LAD & LCX.   Diastolic dysfunction    a. 01/2017 Echo: EF 55-60%, Gr2 DD. Triv AI. Ao root 4.0cm (was 3.6 cm on CT however).   Family history of premature CAD    a. Father died of MI @ 33. Mother died of MI @ 79. Brother multiple strokes.   Hyperlipidemia    Hypertension 12/26/2017     Vital Signs: There were no vitals taken for this visit.   Review of  Systems  Constitutional: Negative.   HENT: Negative.    Respiratory: Negative.    Skin:  Positive for wound.    Physical Exam HENT:     Head: Normocephalic.  Pulmonary:     Effort: Pulmonary effort is normal.  Musculoskeletal:       Feet:  Skin:    General: Skin is warm.     Comments: Small fissure between toes as highlighted   Neurological:     Mental Status: He is alert.       Assessment/Plan: 1. Fungal infection Keep area dry Cotton ball between toes to decrease pressure   Likely secondary yeast/fungal infection from antibiotic use.   - nystatin cream (MYCOSTATIN); Apply 1 Application topically 2 (two) times daily.  Dispense: 30 g; Refill: 0    General Counseling: Micheal Holland verbalizes understanding of the findings of todays visit and agrees with plan of treatment. I have discussed any further diagnostic evaluation that may be needed or ordered today. We also reviewed his medications today. he has been encouraged to call the office with any questions or concerns that should arise related to todays visit.   Time spent:7 [facility] Family Nurse Practitioner

## 2021-10-10 ENCOUNTER — Encounter: Payer: Self-pay | Admitting: Physician Assistant

## 2021-10-10 ENCOUNTER — Ambulatory Visit (INDEPENDENT_AMBULATORY_CARE_PROVIDER_SITE_OTHER): Payer: Self-pay | Admitting: Physician Assistant

## 2021-10-10 ENCOUNTER — Telehealth: Payer: Self-pay | Admitting: Physician Assistant

## 2021-10-10 DIAGNOSIS — I10 Essential (primary) hypertension: Secondary | ICD-10-CM

## 2021-10-10 MED ORDER — LOSARTAN POTASSIUM 50 MG PO TABS
50.0000 mg | ORAL_TABLET | Freq: Every day | ORAL | 0 refills | Status: AC
Start: 2021-10-10 — End: ?

## 2021-10-10 NOTE — Progress Notes (Signed)
Please see telephone note. Pt requested Rx for Losartan '50mg'$  once daily. Rx was sent to preferred [facility]. Pt was also sent contact information for [facility] at [facility] to get established with PCP. Pt prefers a clinic close to [facility].

## 2021-10-10 NOTE — Telephone Encounter (Signed)
Micheal Holland requested a refill of Losartan '50mg'$  once daily medicine for his HTN. Pt is compliant. Rx sent to his preferred [facility] pharmacy. Recommended to get established with PCP. Pt refers to be established at [facility] at [place]. Contact information sent to patient portal.

## 2021-10-11 ENCOUNTER — Other Ambulatory Visit: Payer: Self-pay | Admitting: Nurse Practitioner

## 2021-10-11 DIAGNOSIS — B49 Unspecified mycosis: Secondary | ICD-10-CM

## 2021-10-19 ENCOUNTER — Other Ambulatory Visit: Payer: Self-pay

## 2021-10-19 ENCOUNTER — Other Ambulatory Visit: Payer: BC Managed Care – PPO

## 2021-10-19 DIAGNOSIS — Z131 Encounter for screening for diabetes mellitus: Secondary | ICD-10-CM

## 2021-10-19 DIAGNOSIS — Z13220 Encounter for screening for lipoid disorders: Secondary | ICD-10-CM

## 2021-10-19 DIAGNOSIS — Z125 Encounter for screening for malignant neoplasm of prostate: Secondary | ICD-10-CM

## 2021-10-19 DIAGNOSIS — Z1321 Encounter for screening for nutritional disorder: Secondary | ICD-10-CM

## 2021-10-20 LAB — CMP12+LP+TP+TSH+6AC+PSA+CBC…
ALT: 26 IU/L (ref 0–44)
AST: 32 IU/L (ref 0–40)
Albumin/Globulin Ratio: 2 (ref 1.2–2.2)
Albumin: 4.7 g/dL (ref 3.8–4.9)
Alkaline Phosphatase: 70 IU/L (ref 44–121)
BUN/Creatinine Ratio: 14 (ref 9–20)
BUN: 14 mg/dL (ref 6–24)
Basophils Absolute: 0.1 10*3/uL (ref 0.0–0.2)
Basos: 1 %
Bilirubin Total: 0.6 mg/dL (ref 0.0–1.2)
Calcium: 9.8 mg/dL (ref 8.7–10.2)
Chloride: 107 mmol/L — ABNORMAL HIGH (ref 96–106)
Chol/HDL Ratio: 2.1 ratio (ref 0.0–5.0)
Cholesterol, Total: 147 mg/dL (ref 100–199)
Creatinine, Ser: 1 mg/dL (ref 0.76–1.27)
EOS (ABSOLUTE): 0.2 10*3/uL (ref 0.0–0.4)
Eos: 3 %
Estimated CHD Risk: <0.5 times avg. (ref 0.0–1.0)
Free Thyroxine Index: 1.8 (ref 1.2–4.9)
GGT: 32 IU/L (ref 0–65)
Globulin, Total: 2.3 g/dL (ref 1.5–4.5)
Glucose: 104 mg/dL — ABNORMAL HIGH (ref 70–99)
HDL: 71 mg/dL (ref >39)
Hematocrit: 42.1 % (ref 37.5–51.0)
Hemoglobin: 14.3 g/dL (ref 13.0–17.7)
Immature Grans (Abs): 0 10*3/uL (ref 0.0–0.1)
Immature Granulocytes: 0 %
Iron: 84 ug/dL (ref 38–169)
LDH: 291 IU/L — ABNORMAL HIGH (ref 121–224)
LDL Chol Calc (NIH): 62 mg/dL (ref 0–99)
Lymphocytes Absolute: 1.8 10*3/uL (ref 0.7–3.1)
Lymphs: 33 %
MCH: 30.2 pg (ref 26.6–33.0)
MCHC: 34 g/dL (ref 31.5–35.7)
MCV: 89 fL (ref 79–97)
Monocytes Absolute: 0.5 10*3/uL (ref 0.1–0.9)
Monocytes: 10 %
Neutrophils Absolute: 2.9 10*3/uL (ref 1.4–7.0)
Neutrophils: 53 %
Phosphorus: 3.8 mg/dL (ref 2.8–4.1)
Platelets: 318 10*3/uL (ref 150–450)
Potassium: 4.9 mmol/L (ref 3.5–5.2)
Prostate Specific Ag, Serum: 0.4 ng/mL (ref 0.0–4.0)
RBC: 4.73 x10E6/uL (ref 4.14–5.80)
RDW: 13.5 % (ref 11.6–15.4)
Sodium: 144 mmol/L (ref 134–144)
T3 Uptake Ratio: 28 % (ref 24–39)
T4, Total: 6.4 ug/dL (ref 4.5–12.0)
TSH: 2.2 u[IU]/mL (ref 0.450–4.500)
Total Protein: 7 g/dL (ref 6.0–8.5)
Triglycerides: 70 mg/dL (ref 0–149)
Uric Acid: 4.5 mg/dL (ref 3.8–8.4)
VLDL Cholesterol Cal: 14 mg/dL (ref 5–40)
WBC: 5.4 10*3/uL (ref 3.4–10.8)
eGFR: 87 mL/min/{1.73_m2} (ref >59)

## 2021-10-20 LAB — HGB A1C W/O EAG: Hgb A1c MFr Bld: 5.9 % — ABNORMAL HIGH (ref 4.8–5.6)

## 2021-10-20 LAB — VITAMIN D 25 HYDROXY (VIT D DEFICIENCY, FRACTURES): Vit D, 25-Hydroxy: 18.6 ng/mL — ABNORMAL LOW (ref 30.0–100.0)

## 2021-10-25 ENCOUNTER — Ambulatory Visit: Payer: BC Managed Care – PPO

## 2021-10-25 ENCOUNTER — Other Ambulatory Visit: Payer: Self-pay | Admitting: *Deleted

## 2021-10-25 DIAGNOSIS — Z125 Encounter for screening for malignant neoplasm of prostate: Secondary | ICD-10-CM

## 2021-10-27 LAB — PSA: Prostate Specific Ag, Serum: 0.4 ng/mL (ref 0.0–4.0)

## 2021-10-27 NOTE — Progress Notes (Signed)
Patient: Micheal Holland           Date of Birth: [birth year]           MRN: [id number] Visit Date: 10/25/2021 PCP: Apolonio Schneiders, FNP  Prostate Cancer Screening Date of last physical exam: 10/23/20 Date of last rectal exam:  (October 2020) Have you ever had any of the following?: None Have you ever had or been told you have an allergy to latex products?: No Are you currently taking any natural prostate preparations?: No Are you currently experiencing any urinary symptoms?: No  Prostate Exam Exam not completed. PSA blood test only.  Patient's History Patient Active Problem List   Diagnosis Date Noted   Hypertension 12/26/2017   Elevated coronary artery calcium score 01/29/2017   Vitamin D deficiency 11/04/2016   Alcohol use 11-04-2016   Prediabetes 11/04/2016   Hyperlipidemia 11/04/16   Family history of death due to heart problem at 48 years of age or younger 2016/11/04   History of farsightedness Nov 04, 2016   Past Medical History:  Diagnosis Date   Allergy    Coronary artery calcification seen on CT scan    a. 01/2017 CT Cor Ca2+ = 42 (74th %'ile). Ca2+ noted in prox/mid LAD & LCX.   Diastolic dysfunction    a. 01/2017 Echo: EF 55-60%, Gr2 DD. Triv AI. Ao root 4.0cm (was 3.6 cm on CT however).   Family history of premature CAD    a. Father died of MI @ 34. Mother died of MI @ 55. Brother multiple strokes.   Hyperlipidemia    Hypertension 12/26/2017    Family History  Problem Relation Age of Onset   Hypertension Mother        heart disease died 99    Heart disease Mother    Heart attack Mother    Heart disease Father        "hole  behind heart  73 years old died from this "   Hypertension Father    Heart attack Father    Kidney disease Sister        one sister has had transplant kidney / one sister died with tumor was J. witness did not do surgery   Heart Problems Sister    Heart disease Brother        heart disease "hole behind heart"   Heart attack  Brother     Social History   Occupational History   Not on file  Tobacco Use   Smoking status: Never   Smokeless tobacco: Never  Vaping Use   Vaping Use: Never used  Substance and Sexual Activity   Alcohol use: Yes    Alcohol/week: 4.0 standard drinks of alcohol    Types: 4 Cans of beer per week    Comment: Beer daily 1- 40 oz   Drug use: No   Sexual activity: Yes    Birth control/protection: None

## 2021-11-02 ENCOUNTER — Ambulatory Visit (INDEPENDENT_AMBULATORY_CARE_PROVIDER_SITE_OTHER): Payer: Self-pay | Admitting: Nurse Practitioner

## 2021-11-02 ENCOUNTER — Encounter: Payer: Self-pay | Admitting: Nurse Practitioner

## 2021-11-02 ENCOUNTER — Other Ambulatory Visit: Payer: Self-pay

## 2021-11-02 ENCOUNTER — Telehealth: Payer: Self-pay

## 2021-11-02 VITALS — BP 130/78 | HR 70 | Temp 98.7°F | Ht 74.5 in | Wt 209.0 lb

## 2021-11-02 DIAGNOSIS — Z23 Encounter for immunization: Secondary | ICD-10-CM

## 2021-11-02 DIAGNOSIS — I10 Essential (primary) hypertension: Secondary | ICD-10-CM

## 2021-11-02 DIAGNOSIS — E785 Hyperlipidemia, unspecified: Secondary | ICD-10-CM

## 2021-11-02 DIAGNOSIS — Z86010 Personal history of colonic polyps: Secondary | ICD-10-CM

## 2021-11-02 DIAGNOSIS — Z1211 Encounter for screening for malignant neoplasm of colon: Secondary | ICD-10-CM

## 2021-11-02 DIAGNOSIS — N529 Male erectile dysfunction, unspecified: Secondary | ICD-10-CM

## 2021-11-02 DIAGNOSIS — H6121 Impacted cerumen, right ear: Secondary | ICD-10-CM

## 2021-11-02 DIAGNOSIS — E559 Vitamin D deficiency, unspecified: Secondary | ICD-10-CM

## 2021-11-02 MED ORDER — NA SULFATE-K SULFATE-MG SULF 17.5-3.13-1.6 GM/177ML PO SOLN: 1.0000 | Freq: Once | ORAL | 0 refills | Status: AC

## 2021-11-02 MED ORDER — SILDENAFIL CITRATE 20 MG PO TABS: 20.0000 mg | ORAL_TABLET | ORAL | 11 refills | Status: AC | PRN

## 2021-11-02 NOTE — Progress Notes (Signed)
[facility] [address], [place] [address] [phone]  Office Visit Note  Patient Name: Micheal Holland Date of Birth [id number]  Medical Record number [id number]  Date of Service: 11/02/2021  Chief Complaint  Patient presents with   Annual Exam    Right ear might need to be flushed out.      HPI  61 year old male presenting to [facility] for annual physical   History includes HTN, hyperlipidema, vitamin D deficiency, prediabetic.  Colonoscopy from 03/2016 Results: COLON POLYP, ASCENDING; COLD BIOPSY:  - TUBULAR ADENOMA.  - NEGATIVE FOR HIGH-GRADE DYSPLASIA AND MALIGNANCY.   Has not had annual flu vaccine  Declines COVID vaccine  Has not been taking Vitamin D supplements recently  Current Medication:  Current Outpatient Medications  Medication Instructions   amLODipine (NORVASC) 5 mg, Oral, Daily   aspirin EC 81 mg, Oral, Takes 1 tablet PO occasionally Swallow whole.   losartan (COZAAR) 50 mg, Oral, Daily   nystatin cream (MYCOSTATIN) APPLY TOPICALLY TWO TIMES DAILY   rosuvastatin (CRESTOR) 40 MG tablet Take 1 tablet by mouth once daily   sildenafil (REVATIO) 20 mg, Oral, As needed   Vitamin D3 5,000 Units, Oral, Daily        Medical History: Past Medical History:  Diagnosis Date   Allergy    Coronary artery calcification seen on CT scan    a. 01/2017 CT Cor Ca2+ = 42 (74th %'ile). Ca2+ noted in prox/mid LAD & LCX.   Diastolic dysfunction    a. 01/2017 Echo: EF 55-60%, Gr2 DD. Triv AI. Ao root 4.0cm (was 3.6 cm on CT however).   Family history of premature CAD    a. Father died of MI @ 37. Mother died of MI @ 65. Brother multiple strokes.   Hyperlipidemia    Hypertension 12/26/2017     Vital Signs: BP (!) 140/90 (BP Location: Left Arm, Patient Position: Sitting, Cuff Size: Normal)   Pulse 70   Temp 98.7 F (37.1 C) (Tympanic)   Ht 6' 2.5" (1.892 m)   Wt 209 lb (94.8 kg)   SpO2 100%   BMI 26.48 kg/m   Re  measured blood pressure after resting in office 130/78    Review of Systems  Constitutional: Negative.   HENT: Negative.    Eyes: Negative.   Respiratory: Negative.    Cardiovascular: Negative.   Gastrointestinal: Negative.   Genitourinary: Negative.   Musculoskeletal: Negative.   Neurological: Negative.     Physical Exam HENT:     Head: Normocephalic.     Right Ear: There is impacted cerumen.     Left Ear: Tympanic membrane, ear canal and external ear normal.     Nose: Nose normal.     Mouth/Throat:     Mouth: Mucous membranes are moist.  Eyes:     Pupils: Pupils are equal, round, and reactive to light.  Cardiovascular:     Rate and Rhythm: Normal rate and regular rhythm.     Heart sounds: Normal heart sounds.  Pulmonary:     Effort: Pulmonary effort is normal.     Breath sounds: Normal breath sounds.  Abdominal:     General: Abdomen is flat.  Musculoskeletal:        General: Normal range of motion.     Cervical back: Normal range of motion.  Skin:    General: Skin is warm.  Neurological:     General: No focal deficit present.  Mental Status: He is alert and oriented to person, place, and time. Mental status is at baseline.  Psychiatric:        Mood and Affect: Mood normal.        Behavior: Behavior normal.        Thought Content: Thought content normal.        Judgment: Judgment normal.      Assessment/Plan: 1. Needs flu shot  - Flu Vaccine QUAD 6+ mos PF IM (Fluarix Quad PF)  2. Erectile dysfunction, unspecified erectile dysfunction type  - sildenafil (REVATIO) 20 MG tablet; Take 1 tablet (20 mg total) by mouth as needed (prior to intercourse).  Dispense: 30 tablet; Refill: 11  3. Colon cancer screening - Ambulatory referral to Gastroenterology  4. Impacted cerumen of right ear  - EAR CERUMEN REMOVAL  5. Primary hypertension Continue Norvasc and Losartan daily Advised on getting free BP cuff via [employer]  Continue low sodium diet   6.  Hyperlipidemia, unspecified hyperlipidemia type Continue daily  baby ASA and Crestor   7. Vitamin D deficiency Restart 2,000units daily     General Counseling: Micheal Holland verbalizes understanding of the findings of todays visit and agrees with plan of treatment. I have discussed any further diagnostic evaluation that may be needed or ordered today. We also reviewed his medications today. he has been encouraged to call the office with any questions or concerns that should arise related to todays visit.    Time spent:30 [facility] Family Nurse Practitioner

## 2021-11-02 NOTE — Telephone Encounter (Signed)
Gastroenterology Pre-Procedure Review  Request Date: 11/23/21 Requesting Physician: Dr. Vicente Males  PATIENT REVIEW QUESTIONS: The patient responded to the following health history questions as indicated:    1. Are you having any GI issues? no 2. Do you have a personal history of Polyps? yes (last colonoscopy performed by Dr. Jamal Collin 04/03/16) 3. Do you have a family history of Colon Cancer or Polyps? no 4. Diabetes Mellitus? no 5. Joint replacements in the past 12 months?no 6. Major health problems in the past 3 months?no 7. Any artificial heart valves, MVP, or defibrillator?no    MEDICATIONS & ALLERGIES:    Patient reports the following regarding taking any anticoagulation/antiplatelet therapy:   Plavix, Coumadin, Eliquis, Xarelto, Lovenox, Pradaxa, Brilinta, or Effient? no Aspirin? yes ('81mg'$ )  Patient confirms/reports the following medications:  Current Outpatient Medications  Medication Sig Dispense Refill   amLODipine (NORVASC) 5 MG tablet Take 1 tablet (5 mg total) by mouth daily. 90 tablet 3   aspirin EC 81 MG tablet Take 81 mg by mouth. Takes 1 tablet PO occasionally Swallow whole.     Cholecalciferol (VITAMIN D3) 125 MCG (5000 UT) CAPS Take 1 capsule (5,000 Units total) by mouth daily. (Patient not taking: Reported on 08/16/2021) 90 capsule 1   losartan (COZAAR) 50 MG tablet Take 1 tablet (50 mg total) by mouth daily. 90 tablet 0   nystatin cream (MYCOSTATIN) APPLY TOPICALLY TWO TIMES DAILY 30 g 0   rosuvastatin (CRESTOR) 40 MG tablet Take 1 tablet by mouth once daily 90 tablet 0   sildenafil (REVATIO) 20 MG tablet Take 1 tablet (20 mg total) by mouth as needed (prior to intercourse). 30 tablet 11   No current facility-administered medications for this visit.    Patient confirms/reports the following allergies:  Allergies  Allergen Reactions   Other Other (See Comments)    Fresh fruit - makes throat itch, canned does not    No orders of the defined types were placed in this  encounter.   AUTHORIZATION INFORMATION Primary Insurance: 1D#: Group #:  Secondary Insurance: 1D#: Group #:  SCHEDULE INFORMATION: Date: 11/23/21 Time: Location: [facility]

## 2021-11-15 ENCOUNTER — Telehealth: Payer: Self-pay

## 2021-11-15 DIAGNOSIS — Z86010 Personal history of colonic polyps: Secondary | ICD-10-CM

## 2021-11-15 NOTE — Telephone Encounter (Signed)
Patient contacted office to reschedule his colonoscopy from 11/23/21 to 02/21/22.  Trish in Endo has been notified of this date change.  Thanks,  [place], [place]

## 2021-11-23 ENCOUNTER — Other Ambulatory Visit: Payer: Self-pay | Admitting: Nurse Practitioner

## 2021-11-23 DIAGNOSIS — E785 Hyperlipidemia, unspecified: Secondary | ICD-10-CM

## 2021-12-21 ENCOUNTER — Encounter: Payer: Self-pay | Admitting: Nurse Practitioner

## 2022-01-08 ENCOUNTER — Emergency Department
Admission: EM | Admit: 2022-01-08 | Discharge: 2022-01-08 | Disposition: A | Discharge status: Home / Self Care | Payer: No Typology Code available for payment source | Attending: Emergency Medicine | Admitting: Emergency Medicine

## 2022-01-08 ENCOUNTER — Emergency Department: Payer: No Typology Code available for payment source

## 2022-01-08 ENCOUNTER — Other Ambulatory Visit: Payer: Self-pay

## 2022-01-08 DIAGNOSIS — Y9289 Other specified places as the place of occurrence of the external cause: Secondary | ICD-10-CM | POA: Insufficient documentation

## 2022-01-08 DIAGNOSIS — I10 Essential (primary) hypertension: Secondary | ICD-10-CM | POA: Insufficient documentation

## 2022-01-08 DIAGNOSIS — Y998 Other external cause status: Secondary | ICD-10-CM | POA: Insufficient documentation

## 2022-01-08 DIAGNOSIS — X500XXA Overexertion from strenuous movement or load, initial encounter: Secondary | ICD-10-CM | POA: Insufficient documentation

## 2022-01-08 DIAGNOSIS — S59901A Unspecified injury of right elbow, initial encounter: Secondary | ICD-10-CM | POA: Insufficient documentation

## 2022-01-08 DIAGNOSIS — Y9389 Activity, other specified: Secondary | ICD-10-CM | POA: Insufficient documentation

## 2022-01-08 DIAGNOSIS — M79601 Pain in right arm: Secondary | ICD-10-CM

## 2022-01-08 NOTE — ED Provider Notes (Signed)
[facility] Provider Note    Event Date/Time   First MD Initiated Contact with Patient 01/08/22 1118     (approximate)   History   Arm Injury   HPI  Micheal Holland is a 61 y.o. male left-hand-dominant presents today for evaluation of right elbow injury.  Patient reports that he thinks that he strained it while lifting boxes at work.  He reports pain with flexion of his elbow.  He denies any weakness or paresthesias.  Patient Active Problem List   Diagnosis Date Noted   Hypertension 12/26/2017   Elevated coronary artery calcium score 01/29/2017   Vitamin D deficiency 2016/11/09   Alcohol use 09-Nov-2016   Prediabetes 11/09/2016   Hyperlipidemia 09-Nov-2016   Family history of death due to heart problem at 59 years of age or younger 2016-11-09   History of farsightedness November 09, 2016          Physical Exam   Triage Vital Signs: ED Triage Vitals  Enc Vitals Group     BP 01/08/22 1045 125/80     Pulse Rate 01/08/22 1045 81     Resp 01/08/22 1045 20     Temp 01/08/22 1045 98.4 F (36.9 C)     Temp Source 01/08/22 1045 Oral     SpO2 01/08/22 1045 96 %     Weight 01/08/22 1045 230 lb (104.3 kg)     Height 01/08/22 1045 6' 2.5" (1.892 m)     Head Circumference --      Peak Flow --      Pain Score 01/08/22 1048 7     Pain Loc --      Pain Edu? --      Excl. in [place]? --     Most recent vital signs: Vitals:   01/08/22 1045  BP: 125/80  Pulse: 81  Resp: 20  Temp: 98.4 F (36.9 C)  SpO2: 96%    Physical Exam Vitals and nursing note reviewed.  Constitutional:      General: Awake and alert. No acute distress.    Appearance: Normal appearance. The patient is normal weight.  HENT:     Head: Normocephalic and atraumatic.     Mouth: Mucous membranes are moist.  Eyes:     General: PERRL. Normal EOMs        Right eye: No discharge.        Left eye: No discharge.     Conjunctiva/sclera: Conjunctivae normal.  Cardiovascular:     Rate  and Rhythm: Normal rate and regular rhythm.     Pulses: Normal pulses.     Heart sounds: Normal heart sounds Pulmonary:     Effort: Pulmonary effort is normal. No respiratory distress.     Breath sounds: Normal breath sounds.  Abdominal:     Abdomen is soft. There is no abdominal tenderness. No rebound or guarding. No distention. Musculoskeletal:        General: No swelling. Normal range of motion.     Cervical back: Normal range of motion and neck supple.  Right upper extremity: No obvious deformity, swelling, erythema, ecchymosis.  He has full normal range of motion.  Compartments are soft compressible.  No pain with resisted supination or resisted pronation.  Normal grip strength.  Able to give thumbs up, cross fingers, make okay sign and hold closed against resistance.  Full normal range of motion at the level of the wrist and shoulder.  No Popeye deformity, normal bicep strength.  Normal radial  pulse. Skin:    General: Skin is warm and dry.     Capillary Refill: Capillary refill takes less than 2 seconds.     Findings: No rash.  Neurological:     Mental Status: The patient is awake and alert.      ED Results / Procedures / Treatments   Labs (all labs ordered are listed, but only abnormal results are displayed) Labs Reviewed - No data to display   EKG     RADIOLOGY I independently reviewed and interpreted imaging and agree with radiologists findings.     PROCEDURES:  Critical Care performed:   Procedures   MEDICATIONS ORDERED IN ED: Medications - No data to display   IMPRESSION / MDM / [place] / ED COURSE  I reviewed the triage vital signs and the nursing notes.   Differential diagnosis includes, but is not limited to, strain, tendinitis, less likely fracture or dislocation.  Patient is awake and alert, hemodynamically stable and neurovascularly intact.  There is no evidence of infection, no erythema or warmth or wounds noted.  Pain is reproducible  with movement, though he does maintain full range of motion and normal strength and sensation.  He agreed to x-ray.  X-ray demonstrates no acute osseous abnormality though does reveal degenerative changes.  I suspect tendinitis is the most likely etiology of his symptoms today.  He was given an Ace wrap and instructed to follow-up with orthopedics.  The appropriate follow-up information was provided.  Discussed symptomatic management and return precautions.  Patient or stands and agrees with plan.  He was discharged in stable condition.   Patient's presentation is most consistent with acute complicated illness / injury requiring diagnostic workup.    FINAL CLINICAL IMPRESSION(S) / ED DIAGNOSES   Final diagnoses:  Right arm pain     Rx / DC Orders   ED Discharge Orders     None        Note:  This document was prepared using Dragon voice recognition software and may include unintentional dictation errors.   Marquette Old, PA-C 01/08/22 1212    Nathaniel Man, MD 01/08/22 [phone]

## 2022-01-08 NOTE — ED Triage Notes (Signed)
Pt states that he hurt his R arm at work- pt states that he was lifting some heavy boxes when he started having pain- pt states pain is mostly in the elbow

## 2022-01-08 NOTE — ED Notes (Signed)
Xray at bedside. Pt was lifting heavy objects and began to have L elbow pain.

## 2022-01-08 NOTE — Discharge Instructions (Addendum)
Your x-ray does not show any acute changes but demonstrates degenerative changes as we discussed.  You may wear the brace during the day while you are injury is healing, and take it off when you sleep at night.  Please return for any new, worsening, or change in symptoms or other concerns.

## 2022-01-11 ENCOUNTER — Telehealth: Payer: Self-pay

## 2022-01-11 NOTE — Patient Outreach (Signed)
  Care Coordination TOC Note Transition Care Management Follow-up Telephone Call Date of discharge and from where: 01/08/22-[facility] ED Dx: "right arm pain" Red on EMMI-ED Discharge Alert Reason: "Scheduled follow-up appt? No" Red Alert Date: 01/09/22 How have you been since you were released from the hospital? Incoming call from patient returning RN CM call. He voices that he is still having some pain to his arm. He is not taking much pain med as he "forgets to do it." He has not made follow up appt with ortho MD. RN CM discussed importance and encouraged patient to make an appt ASAP. He voiced understanding.  Any questions or concerns? No  Items Reviewed: Did the pt receive and understand the discharge instructions provided? Yes  Medications obtained and verified?  Patient currently in vehicle and not at home Other? Yes  Any new allergies since your discharge? No  Dietary orders reviewed? Yes Do you have support at home? Yes   Home Care and Equipment/Supplies: Were home health services ordered? not applicable If so, what is the name of the agency? N/A  Has the agency set up a time to come to the patient's home? not applicable Were any new equipment or medical supplies ordered?  No What is the name of the medical supply agency? N/A Were you able to get the supplies/equipment? not applicable Do you have any questions related to the use of the equipment or supplies? No  Functional Questionnaire: (I = Independent and D = Dependent) ADLs: I  Bathing/Dressing- I  Meal Prep- I  Eating- I  Maintaining continence- I  Transferring/Ambulation- I  Managing Meds- I  Follow up appointments reviewed:  PCP Hospital f/u appt confirmed? No . [facility] f/u appt confirmed?  Patient will call and make appt  . Are transportation arrangements needed? No  If their condition worsens, is the pt aware to call PCP or go to the Emergency Dept.? Yes Was the patient provided with contact  information for the PCP's office or ED? Yes Was to pt encouraged to call back with questions or concerns? Yes  SDOH assessments and interventions completed:   Yes SDOH Interventions Today    Flowsheet Row Most Recent Value  SDOH Interventions   Food Insecurity Interventions Intervention Not Indicated  Transportation Interventions Intervention Not Indicated       Care Coordination Interventions:  Education provided regarding pain mgmt    Encounter Outcome:  Pt. Visit Completed    Enzo Montgomery, RN,BSN,CCM [place] [facility] Telephonic Care Management Coordinator Direct Phone: [phone] Toll Free: [phone] Fax: [phone]

## 2022-01-11 NOTE — Patient Outreach (Signed)
  Care Coordination TOC Note Transition Care Management Unsuccessful Follow-up Telephone Call  Date of discharge and from where:  01/08/22-[facility] ED  Attempts:  1st Attempt  Reason for unsuccessful TCM follow-up call:  Left voice message     [facility] [place] [facility] Telephonic Care Management Coordinator Direct Phone: [phone] Toll Free: [phone] Fax: [phone]

## 2022-01-18 DIAGNOSIS — Z86010 Personal history of colonic polyps: Secondary | ICD-10-CM | POA: Diagnosis not present

## 2022-02-15 ENCOUNTER — Telehealth: Payer: Self-pay | Admitting: Gastroenterology

## 2022-02-15 NOTE — Telephone Encounter (Signed)
Returned patients phone call to let him know his colonoscopy has been canceled.  Magda Paganini in Endo has been notified of cancellation.  Thanks, [place], [place]

## 2022-02-15 NOTE — Telephone Encounter (Signed)
Pt canceled procedure for 02/21/2022  and will call back to reschedule

## 2022-02-20 ENCOUNTER — Other Ambulatory Visit: Payer: Self-pay | Admitting: Physician Assistant

## 2022-02-20 DIAGNOSIS — E559 Vitamin D deficiency, unspecified: Secondary | ICD-10-CM

## 2022-02-20 DIAGNOSIS — I10 Essential (primary) hypertension: Secondary | ICD-10-CM

## 2022-02-20 DIAGNOSIS — R7303 Prediabetes: Secondary | ICD-10-CM

## 2022-02-20 DIAGNOSIS — E785 Hyperlipidemia, unspecified: Secondary | ICD-10-CM

## 2022-02-20 NOTE — Progress Notes (Signed)
Pt is being established with a PCP in the community next week. Requesting blood work to be performed so he can f/u with the PCP and discuss lab results on his appointment.

## 2022-02-21 ENCOUNTER — Encounter: Admission: RE | Payer: Self-pay | Source: Ambulatory Visit

## 2022-02-21 ENCOUNTER — Ambulatory Visit
Admission: RE | Admit: 2022-02-21 | Payer: BC Managed Care – PPO | Source: Ambulatory Visit | Admitting: Gastroenterology

## 2022-02-21 SURGERY — COLONOSCOPY WITH PROPOFOL
Anesthesia: General

## 2022-02-23 ENCOUNTER — Other Ambulatory Visit: Payer: BC Managed Care – PPO

## 2022-02-23 DIAGNOSIS — E785 Hyperlipidemia, unspecified: Secondary | ICD-10-CM

## 2022-02-23 DIAGNOSIS — R7303 Prediabetes: Secondary | ICD-10-CM

## 2022-02-23 DIAGNOSIS — E559 Vitamin D deficiency, unspecified: Secondary | ICD-10-CM

## 2022-02-23 DIAGNOSIS — I10 Essential (primary) hypertension: Secondary | ICD-10-CM

## 2022-02-24 LAB — LIPID PANEL
Chol/HDL Ratio: 1.8 ratio (ref 0.0–5.0)
Cholesterol, Total: 168 mg/dL (ref 100–199)
HDL: 92 mg/dL (ref >39)
LDL Chol Calc (NIH): 61 mg/dL (ref 0–99)
Triglycerides: 82 mg/dL (ref 0–149)
VLDL Cholesterol Cal: 15 mg/dL (ref 5–40)

## 2022-02-24 LAB — COMPREHENSIVE METABOLIC PANEL
ALT: 43 IU/L (ref 0–44)
AST: 44 IU/L — ABNORMAL HIGH (ref 0–40)
Albumin/Globulin Ratio: 1.7 (ref 1.2–2.2)
Albumin: 4.5 g/dL (ref 3.8–4.9)
Alkaline Phosphatase: 71 IU/L (ref 44–121)
BUN/Creatinine Ratio: 11 (ref 9–20)
BUN: 10 mg/dL (ref 6–24)
Bilirubin Total: 0.7 mg/dL (ref 0.0–1.2)
CO2: 20 mmol/L (ref 20–29)
Calcium: 9.4 mg/dL (ref 8.7–10.2)
Chloride: 104 mmol/L (ref 96–106)
Creatinine, Ser: 0.88 mg/dL (ref 0.76–1.27)
Globulin, Total: 2.6 g/dL (ref 1.5–4.5)
Glucose: 107 mg/dL — ABNORMAL HIGH (ref 70–99)
Potassium: 3.9 mmol/L (ref 3.5–5.2)
Sodium: 140 mmol/L (ref 134–144)
Total Protein: 7.1 g/dL (ref 6.0–8.5)
eGFR: 99 mL/min/{1.73_m2} (ref >59)

## 2022-02-24 LAB — CBC WITH DIFFERENTIAL/PLATELET
Basophils Absolute: 0.1 10*3/uL (ref 0.0–0.2)
Basos: 1 %
EOS (ABSOLUTE): 0.3 10*3/uL (ref 0.0–0.4)
Eos: 7 %
Hematocrit: 45 % (ref 37.5–51.0)
Hemoglobin: 14.8 g/dL (ref 13.0–17.7)
Immature Grans (Abs): 0 10*3/uL (ref 0.0–0.1)
Immature Granulocytes: 0 %
Lymphocytes Absolute: 1.9 10*3/uL (ref 0.7–3.1)
Lymphs: 41 %
MCH: 29.4 pg (ref 26.6–33.0)
MCHC: 32.9 g/dL (ref 31.5–35.7)
MCV: 89 fL (ref 79–97)
Monocytes Absolute: 0.5 10*3/uL (ref 0.1–0.9)
Monocytes: 10 %
Neutrophils Absolute: 1.9 10*3/uL (ref 1.4–7.0)
Neutrophils: 41 %
Platelets: 289 10*3/uL (ref 150–450)
RBC: 5.04 x10E6/uL (ref 4.14–5.80)
RDW: 14.1 % (ref 11.6–15.4)
WBC: 4.7 10*3/uL (ref 3.4–10.8)

## 2022-02-24 LAB — HGB A1C W/O EAG: Hgb A1c MFr Bld: 6.1 % — ABNORMAL HIGH (ref 4.8–5.6)

## 2022-02-24 LAB — VITAMIN D 25 HYDROXY (VIT D DEFICIENCY, FRACTURES): Vit D, 25-Hydroxy: 52.9 ng/mL (ref 30.0–100.0)

## 2022-02-28 DIAGNOSIS — R7303 Prediabetes: Secondary | ICD-10-CM | POA: Diagnosis not present

## 2022-02-28 DIAGNOSIS — E782 Mixed hyperlipidemia: Secondary | ICD-10-CM | POA: Diagnosis not present

## 2022-02-28 DIAGNOSIS — E559 Vitamin D deficiency, unspecified: Secondary | ICD-10-CM | POA: Diagnosis not present

## 2022-02-28 DIAGNOSIS — I10 Essential (primary) hypertension: Secondary | ICD-10-CM | POA: Diagnosis not present

## 2022-05-23 ENCOUNTER — Ambulatory Visit: Payer: BC Managed Care – PPO | Admitting: Physician Assistant

## 2022-06-01 ENCOUNTER — Other Ambulatory Visit: Payer: Self-pay | Admitting: Nurse Practitioner

## 2022-06-01 DIAGNOSIS — N529 Male erectile dysfunction, unspecified: Secondary | ICD-10-CM

## 2022-08-22 ENCOUNTER — Other Ambulatory Visit: Payer: BC Managed Care – PPO

## 2022-08-22 ENCOUNTER — Other Ambulatory Visit: Payer: Self-pay

## 2022-08-22 DIAGNOSIS — R7303 Prediabetes: Secondary | ICD-10-CM

## 2022-08-22 DIAGNOSIS — E785 Hyperlipidemia, unspecified: Secondary | ICD-10-CM

## 2022-08-22 DIAGNOSIS — E559 Vitamin D deficiency, unspecified: Secondary | ICD-10-CM

## 2022-08-22 DIAGNOSIS — I10 Essential (primary) hypertension: Secondary | ICD-10-CM

## 2022-08-29 DIAGNOSIS — I159 Secondary hypertension, unspecified: Secondary | ICD-10-CM | POA: Diagnosis not present

## 2022-08-29 DIAGNOSIS — R972 Elevated prostate specific antigen [PSA]: Secondary | ICD-10-CM | POA: Diagnosis not present

## 2022-08-29 DIAGNOSIS — E782 Mixed hyperlipidemia: Secondary | ICD-10-CM | POA: Diagnosis not present

## 2022-08-29 DIAGNOSIS — R7303 Prediabetes: Secondary | ICD-10-CM | POA: Diagnosis not present

## 2022-08-29 DIAGNOSIS — E559 Vitamin D deficiency, unspecified: Secondary | ICD-10-CM | POA: Diagnosis not present

## 2022-11-28 ENCOUNTER — Ambulatory Visit: Payer: BC Managed Care – PPO | Attending: Medical | Admitting: Medical

## 2022-11-28 ENCOUNTER — Encounter: Payer: Self-pay | Admitting: Medical

## 2022-11-28 VITALS — BP 130/88 | HR 69 | Ht 74.5 in | Wt 219.6 lb

## 2022-11-28 DIAGNOSIS — R931 Abnormal findings on diagnostic imaging of heart and coronary circulation: Secondary | ICD-10-CM | POA: Diagnosis not present

## 2022-11-28 DIAGNOSIS — E782 Mixed hyperlipidemia: Secondary | ICD-10-CM

## 2022-11-28 DIAGNOSIS — I10 Essential (primary) hypertension: Secondary | ICD-10-CM | POA: Diagnosis not present

## 2022-11-28 NOTE — Progress Notes (Signed)
Cardiology Office Note:    Date:  11/28/2022   ID:  Micheal Holland, DOB 06-02-1960, MRN [id number]  PCP:  Viviano Simas, FNP  [facility] Cardiologist:  Lorine Bears, MD  [facility] Electrophysiologist:  None   Referring MD: Viviano Simas, FNP   Chief Complaint: 1 year follow-up  History of Present Illness:    Micheal Holland is a 62 y.o. male with a hx of coronary calcium, diastolic dysfunction, hypertension, hyperlipidemia, and family history of premature CAD, who presents for follow-up.   In the setting of significant family history, he underwent cardiac CT for coronary calcium in January 2019 with a calcium score 42 (74th percentile).  Calcium was noted in the proximal and mid LAD and left circumflex.  Echocardiogram performed the same month, showed normal LV function with grade 2 diastolic dysfunction.  He has been managed with aspirin, statin, and antihypertensive therapy.   Patient was last seen in February 2023 and was overall doing well from a cardiac perspective.  Today, the patient has been doing well over the last year. HE denies chest pain, SOB, lower leg edema, orthopnea, pnd, lightheadedness, dizziness, heart racing, palpitations. He stopped ASA because he didn't want to take it. The patient eats a low salt diet. He is very active at work moving boxes. Diet is good.   Past Medical History:  Diagnosis Date   Allergy    Coronary artery calcification seen on CT scan    a. 01/2017 CT Cor Ca2+ = 42 (74th %'ile). Ca2+ noted in prox/mid LAD & LCX.   Diastolic dysfunction    a. 01/2017 Echo: EF 55-60%, Gr2 DD. Triv AI. Ao root 4.0cm (was 3.6 cm on CT however).   Family history of premature CAD    a. Father died of MI @ 81. Mother died of MI @ 69. Brother multiple strokes.   Hyperlipidemia    Hypertension 12/26/2017    Past Surgical History:  Procedure Laterality Date   COLONOSCOPY WITH PROPOFOL N/A 04/03/2016   Procedure: COLONOSCOPY WITH PROPOFOL;   Surgeon: Christena Deem, MD;  Location: [facility] ENDOSCOPY;  Service: Endoscopy;  Laterality: N/A;   none      Current Medications: Current Meds  Medication Sig   amLODipine (NORVASC) 5 MG tablet Take 1 tablet (5 mg total) by mouth daily.   Cholecalciferol (VITAMIN D3) 125 MCG (5000 UT) CAPS Take 1 capsule (5,000 Units total) by mouth daily.   losartan (COZAAR) 50 MG tablet Take 1 tablet (50 mg total) by mouth daily.   rosuvastatin (CRESTOR) 40 MG tablet Take 1 tablet by mouth once daily   sildenafil (REVATIO) 20 MG tablet Take 1 tablet (20 mg total) by mouth as needed (prior to intercourse).     Allergies:   Other   Social History   Socioeconomic History   Marital status: Single    Spouse name: Not on file   Number of children: Not on file   Years of education: Not on file   Highest education level: Not on file  Occupational History   Not on file  Tobacco Use   Smoking status: Never   Smokeless tobacco: Never  Vaping Use   Vaping status: Never Used  Substance and Sexual Activity   Alcohol use: Yes    Alcohol/week: 4.0 standard drinks of alcohol    Types: 4 Cans of beer per week    Comment: Beer daily 1- 40 oz   Drug use: No   Sexual activity: Yes  Birth control/protection: None  Other Topics Concern   Not on file  Social History Narrative   Left handed   Duplex apartment   Social Determinants of Health   Financial Resource Strain: Low Risk  (08/29/2022)   Received [facility]   Overall Financial Resource Strain (CARDIA)    Difficulty of Paying Living Expenses: Not very hard  Food Insecurity: No Food Insecurity (08/29/2022)   Received from [facility]   Hunger Vital Sign    Worried About Running Out of Food in the Last Year: Never true    Ran Out of Food in the Last Year: Never true  Transportation Needs: No Transportation Needs (08/29/2022)   Received from [facility] - Transportation    In  the past 12 months, has lack of transportation kept you from medical appointments or from getting medications?: No    Lack of Transportation (Non-Medical): No  Physical Activity: Inactive (11/07/2017)   Exercise Vital Sign    Days of Exercise per Week: 0 days    Minutes of Exercise per Session: 0 min  Stress: No Stress Concern Present (11/07/2017)   [employer] of Occupational Health - Occupational Stress Questionnaire    Feeling of Stress : Not at all  Social Connections: Unknown (11/07/2017)   Social Connection and Isolation Panel [NHANES]    Frequency of Communication with Friends and Family: Patient declined    Frequency of Social Gatherings with Friends and Family: Patient declined    Attends Religious Services: Patient declined    Database administrator or Organizations: Patient declined    Attends Banker Meetings: Patient declined    Marital Status: Patient declined     Family History: The patient's family history includes Heart Problems in his sister; Heart attack in his brother, father, and mother; Heart disease in his brother, father, and mother; Hypertension in his father and mother; Kidney disease in his sister.  ROS:   Please see the history of present illness.     All other systems reviewed and are negative.  EKGs/Labs/Other Studies Reviewed:    The following studies were reviewed today:  Cardiac CT 01/2017 IMPRESSION: Coronary calcium score 52 of . This was 72 th percentile for age and sex matched control.   Charlton Haws     Electronically Signed   By: Charlton Haws M.D.   On: 01/15/2017 12:03  Echo 2019 Study Conclusions   - Left ventricle: The cavity size was normal. Systolic function was    normal. The estimated ejection fraction was in the range of 55%    to 60%. Wall motion was normal; there were no regional wall    motion abnormalities. Features are consistent with a pseudonormal    left ventricular filling pattern, with  concomitant abnormal    relaxation and increased filling pressure (grade 2 diastolic    dysfunction).  - Aortic valve: There was trivial regurgitation.  - Aortic root: The aortic root was mild to moderately dilated, 4.0    cm  - Ascending aorta: The ascending aorta was mildly dilated. 3.5 cm  - Left atrium: The atrium was normal in size.  - Right ventricle: Systolic function was normal.  - Pulmonary arteries: Systolic pressure was within the normal    range.   EKG:  EKG is ordered today.  The ekg ordered today demonstrates NSR 69bpm, no changes  Recent Labs: 08/22/2022: ALT 28; BUN 14; Creatinine, Ser 1.10; Hemoglobin  13.9; Platelets 265; Potassium 3.8; Sodium 143  Recent Lipid Panel    Component Value Date/Time   CHOL 150 08/22/2022 0715   TRIG 79 08/22/2022 0715   HDL 84 08/22/2022 0715   CHOLHDL 1.8 02/23/2022 0807   LDLCALC 51 08/22/2022 0715    Physical Exam:    VS:  BP 130/88 (BP Location: Left Arm, Patient Position: Sitting, Cuff Size: Normal)   Pulse 69   Ht 6' 2.5" (1.892 m)   Wt 219 lb 9.6 oz (99.6 kg)   SpO2 99%   BMI 27.82 kg/m     Wt Readings from Last 3 Encounters:  11/28/22 219 lb 9.6 oz (99.6 kg)  01/08/22 230 lb (104.3 kg)  11/02/21 209 lb (94.8 kg)     GEN:  Well nourished, well developed in no acute distress HEENT: Normal NECK: No JVD; No carotid bruits LYMPHATICS: No lymphadenopathy CARDIAC: RRR, no murmurs, rubs, gallops RESPIRATORY:  Clear to auscultation without rales, wheezing or rhonchi  ABDOMEN: Soft, non-tender, non-distended MUSCULOSKELETAL:  No edema; No deformity  SKIN: Warm and dry NEUROLOGIC:  Alert and oriented x 3 PSYCHIATRIC:  Normal affect   ASSESSMENT:    1. Elevated coronary artery calcium score   2. Essential hypertension   3. Hyperlipidemia, mixed    PLAN:    In order of problems listed above:  Elevated coronary calcium score In 2019 coronary calcium score of 52, 74the percentile for age and sex matched. Patient  denies anginal symptoms. EGK stable today. He stopped ASA since he thought he was taking too many medications. Need for ASA was discussed and he voiced his understanding. Continue statin therapy. No further ischemic work-up at this time.  HTN BP 130/88, which is reasonable. Continue amlodipine 5mg  daily and Losartan 50mg  daily. Lifestyle changes discussed.  HLD LDL 51 in 2024. Continue Crestor 40mg  daily.   Disposition: Follow up in 1 year(s) with MD/APP    Signed, Laniece Hornbaker David Stall, PA-C  11/28/2022 10:00 AM    [facility]

## 2022-11-28 NOTE — Patient Instructions (Addendum)
 Medication Instructions:  Your physician recommends that you continue on your current medications as directed. Please refer to the Current Medication list given to you today.   *If you need a refill on your cardiac medications before your next appointment, please call your pharmacy*   Lab Work: No labs ordered today    Testing/Procedures: No test ordered today    Follow-Up: At [facility], you and your health needs are our priority.  As part of our continuing mission to provide you with exceptional heart care, we have created designated Provider Care Teams.  These Care Teams include your primary Cardiologist (physician) and Advanced Practice Providers (APPs -  Physician Assistants and Nurse Practitioners) who all work together to provide you with the care you need, when you need it.  We recommend signing up for the patient portal called "MyChart".  Sign up information is provided on this After Visit Summary.  MyChart is used to connect with patients for Virtual Visits (Telemedicine).  Patients are able to view lab/test results, encounter notes, upcoming appointments, etc.  Non-urgent messages can be sent to your provider as well.   To learn more about what you can do with MyChart, go to [email or web address].    Your next appointment:   1 year(s)  Provider:   You may see Lorine Bears, MD or one of the following Advanced Practice Providers on your designated Care Team:   Nicolasa Ducking, NP Eula Listen, PA-C Cadence Fransico Michael, PA-C Charlsie Quest, NP Carlos Levering, NP

## 2023-02-26 ENCOUNTER — Other Ambulatory Visit: Payer: Self-pay

## 2023-02-26 ENCOUNTER — Other Ambulatory Visit: Payer: BC Managed Care – PPO

## 2023-02-26 DIAGNOSIS — E785 Hyperlipidemia, unspecified: Secondary | ICD-10-CM

## 2023-02-26 DIAGNOSIS — I10 Essential (primary) hypertension: Secondary | ICD-10-CM

## 2023-02-26 DIAGNOSIS — E559 Vitamin D deficiency, unspecified: Secondary | ICD-10-CM

## 2023-02-26 DIAGNOSIS — R7303 Prediabetes: Secondary | ICD-10-CM

## 2023-02-27 LAB — CBC WITH DIFFERENTIAL/PLATELET
Basophils Absolute: 0 10*3/uL (ref 0.0–0.2)
Basos: 1 %
EOS (ABSOLUTE): 0.1 10*3/uL (ref 0.0–0.4)
Eos: 3 %
Hematocrit: 44.8 % (ref 37.5–51.0)
Hemoglobin: 14.5 g/dL (ref 13.0–17.7)
Immature Grans (Abs): 0 10*3/uL (ref 0.0–0.1)
Immature Granulocytes: 0 %
Lymphocytes Absolute: 1.7 10*3/uL (ref 0.7–3.1)
Lymphs: 34 %
MCH: 29.9 pg (ref 26.6–33.0)
MCHC: 32.4 g/dL (ref 31.5–35.7)
MCV: 92 fL (ref 79–97)
Monocytes Absolute: 0.5 10*3/uL (ref 0.1–0.9)
Monocytes: 10 %
Neutrophils Absolute: 2.5 10*3/uL (ref 1.4–7.0)
Neutrophils: 52 %
Platelets: 265 10*3/uL (ref 150–450)
RBC: 4.85 x10E6/uL (ref 4.14–5.80)
RDW: 13.8 % (ref 11.6–15.4)
WBC: 4.9 10*3/uL (ref 3.4–10.8)

## 2023-02-27 LAB — COMPREHENSIVE METABOLIC PANEL
ALT: 34 [IU]/L (ref 0–44)
AST: 35 [IU]/L (ref 0–40)
Albumin: 4.6 g/dL (ref 3.8–4.9)
Alkaline Phosphatase: 69 [IU]/L (ref 44–121)
BUN/Creatinine Ratio: 14 (ref 10–24)
BUN: 15 mg/dL (ref 8–27)
Bilirubin Total: 0.7 mg/dL (ref 0.0–1.2)
CO2: 23 mmol/L (ref 20–29)
Calcium: 9.7 mg/dL (ref 8.6–10.2)
Chloride: 108 mmol/L — ABNORMAL HIGH (ref 96–106)
Creatinine, Ser: 1.09 mg/dL (ref 0.76–1.27)
Globulin, Total: 2.4 g/dL (ref 1.5–4.5)
Glucose: 98 mg/dL (ref 70–99)
Potassium: 4.3 mmol/L (ref 3.5–5.2)
Sodium: 145 mmol/L — ABNORMAL HIGH (ref 134–144)
Total Protein: 7 g/dL (ref 6.0–8.5)
eGFR: 78 mL/min/{1.73_m2} (ref >59)

## 2023-02-27 LAB — LIPID PANEL WITH LDL/HDL RATIO
Cholesterol, Total: 179 mg/dL (ref 100–199)
HDL: 93 mg/dL (ref >39)
LDL Chol Calc (NIH): 75 mg/dL (ref 0–99)
LDL/HDL Ratio: 0.8 {ratio} (ref 0.0–3.6)
Triglycerides: 57 mg/dL (ref 0–149)
VLDL Cholesterol Cal: 11 mg/dL (ref 5–40)

## 2023-02-27 LAB — HEMOGLOBIN A1C
Est. average glucose Bld gHb Est-mCnc: 128 mg/dL
Hgb A1c MFr Bld: 6.1 % — ABNORMAL HIGH (ref 4.8–5.6)

## 2023-02-27 LAB — VITAMIN D 25 HYDROXY (VIT D DEFICIENCY, FRACTURES): Vit D, 25-Hydroxy: 31.8 ng/mL (ref 30.0–100.0)

## 2023-03-02 DIAGNOSIS — Z114 Encounter for screening for human immunodeficiency virus [HIV]: Secondary | ICD-10-CM | POA: Diagnosis not present

## 2023-03-02 DIAGNOSIS — Z0000 Encounter for general adult medical examination without abnormal findings: Secondary | ICD-10-CM | POA: Diagnosis not present

## 2023-03-02 DIAGNOSIS — Z1159 Encounter for screening for other viral diseases: Secondary | ICD-10-CM | POA: Diagnosis not present

## 2023-03-02 DIAGNOSIS — Z1211 Encounter for screening for malignant neoplasm of colon: Secondary | ICD-10-CM | POA: Diagnosis not present

## 2023-08-03 ENCOUNTER — Other Ambulatory Visit

## 2023-09-07 ENCOUNTER — Other Ambulatory Visit: Payer: Self-pay

## 2023-09-07 DIAGNOSIS — I10 Essential (primary) hypertension: Secondary | ICD-10-CM

## 2023-09-07 DIAGNOSIS — R7303 Prediabetes: Secondary | ICD-10-CM

## 2023-09-07 DIAGNOSIS — E785 Hyperlipidemia, unspecified: Secondary | ICD-10-CM

## 2023-09-07 DIAGNOSIS — E559 Vitamin D deficiency, unspecified: Secondary | ICD-10-CM

## 2023-09-12 ENCOUNTER — Other Ambulatory Visit

## 2023-09-12 DIAGNOSIS — E559 Vitamin D deficiency, unspecified: Secondary | ICD-10-CM

## 2023-09-12 DIAGNOSIS — I10 Essential (primary) hypertension: Secondary | ICD-10-CM

## 2023-09-12 DIAGNOSIS — E785 Hyperlipidemia, unspecified: Secondary | ICD-10-CM

## 2023-09-12 DIAGNOSIS — R7303 Prediabetes: Secondary | ICD-10-CM

## 2023-09-13 LAB — COMPREHENSIVE METABOLIC PANEL WITH GFR
ALT: 28 IU/L (ref 0–44)
AST: 37 IU/L (ref 0–40)
Albumin: 4.7 g/dL (ref 3.9–4.9)
Alkaline Phosphatase: 60 IU/L (ref 44–121)
BUN/Creatinine Ratio: 12 (ref 10–24)
BUN: 12 mg/dL (ref 8–27)
Bilirubin Total: 0.5 mg/dL (ref 0.0–1.2)
CO2: 22 mmol/L (ref 20–29)
Calcium: 9.6 mg/dL (ref 8.6–10.2)
Chloride: 104 mmol/L (ref 96–106)
Creatinine, Ser: 1 mg/dL (ref 0.76–1.27)
Globulin, Total: 2.4 g/dL (ref 1.5–4.5)
Glucose: 94 mg/dL (ref 70–99)
Potassium: 4.3 mmol/L (ref 3.5–5.2)
Sodium: 143 mmol/L (ref 134–144)
Total Protein: 7.1 g/dL (ref 6.0–8.5)
eGFR: 86 mL/min/1.73 (ref >59)

## 2023-09-13 LAB — CBC WITH DIFFERENTIAL/PLATELET
Basophils Absolute: 0.1 x10E3/uL (ref 0.0–0.2)
Basos: 1 %
EOS (ABSOLUTE): 0.3 x10E3/uL (ref 0.0–0.4)
Eos: 7 %
Hematocrit: 44.8 % (ref 37.5–51.0)
Hemoglobin: 14.6 g/dL (ref 13.0–17.7)
Immature Grans (Abs): 0 x10E3/uL (ref 0.0–0.1)
Immature Granulocytes: 0 %
Lymphocytes Absolute: 1.5 x10E3/uL (ref 0.7–3.1)
Lymphs: 37 %
MCH: 30.3 pg (ref 26.6–33.0)
MCHC: 32.6 g/dL (ref 31.5–35.7)
MCV: 93 fL (ref 79–97)
Monocytes Absolute: 0.5 x10E3/uL (ref 0.1–0.9)
Monocytes: 11 %
Neutrophils Absolute: 1.8 x10E3/uL (ref 1.4–7.0)
Neutrophils: 44 %
Platelets: 269 x10E3/uL (ref 150–450)
RBC: 4.82 x10E6/uL (ref 4.14–5.80)
RDW: 13.7 % (ref 11.6–15.4)
WBC: 4.1 x10E3/uL (ref 3.4–10.8)

## 2023-09-13 LAB — LIPID PANEL
Chol/HDL Ratio: 2 ratio (ref 0.0–5.0)
Cholesterol, Total: 161 mg/dL (ref 100–199)
HDL: 79 mg/dL (ref >39)
LDL Chol Calc (NIH): 60 mg/dL (ref 0–99)
Triglycerides: 127 mg/dL (ref 0–149)
VLDL Cholesterol Cal: 22 mg/dL (ref 5–40)

## 2023-09-13 LAB — HIV ANTIBODY (ROUTINE TESTING W REFLEX): HIV Screen 4th Generation wRfx: NONREACTIVE

## 2023-09-13 LAB — HEMOGLOBIN A1C
Est. average glucose Bld gHb Est-mCnc: 131 mg/dL
Hgb A1c MFr Bld: 6.2 % — ABNORMAL HIGH (ref 4.8–5.6)

## 2023-09-13 LAB — PSA: Prostate Specific Ag, Serum: 0.4 ng/mL (ref 0.0–4.0)

## 2023-09-13 LAB — TSH: TSH: 2.78 u[IU]/mL (ref 0.450–4.500)

## 2023-09-14 ENCOUNTER — Ambulatory Visit (INDEPENDENT_AMBULATORY_CARE_PROVIDER_SITE_OTHER)

## 2023-09-14 DIAGNOSIS — K449 Diaphragmatic hernia without obstruction or gangrene: Secondary | ICD-10-CM | POA: Diagnosis not present

## 2023-09-14 DIAGNOSIS — K222 Esophageal obstruction: Secondary | ICD-10-CM | POA: Diagnosis not present

## 2023-09-14 DIAGNOSIS — K5730 Diverticulosis of large intestine without perforation or abscess without bleeding: Secondary | ICD-10-CM | POA: Diagnosis not present

## 2023-09-14 DIAGNOSIS — Z09 Encounter for follow-up examination after completed treatment for conditions other than malignant neoplasm: Secondary | ICD-10-CM | POA: Diagnosis not present

## 2023-09-14 DIAGNOSIS — K2950 Unspecified chronic gastritis without bleeding: Secondary | ICD-10-CM | POA: Diagnosis not present

## 2023-09-14 DIAGNOSIS — R1319 Other dysphagia: Secondary | ICD-10-CM | POA: Diagnosis present

## 2023-09-14 DIAGNOSIS — R634 Abnormal weight loss: Secondary | ICD-10-CM | POA: Diagnosis not present

## 2023-09-14 DIAGNOSIS — F1010 Alcohol abuse, uncomplicated: Secondary | ICD-10-CM | POA: Diagnosis not present

## 2023-09-14 DIAGNOSIS — Z860101 Personal history of adenomatous and serrated colon polyps: Secondary | ICD-10-CM | POA: Diagnosis not present

## 2023-09-14 DIAGNOSIS — D128 Benign neoplasm of rectum: Secondary | ICD-10-CM | POA: Diagnosis not present

## 2023-10-17 IMAGING — MR MR CERVICAL SPINE WO/W CM
6 of 8 series · 32 of 48 positions shown · IV contrast (multihance)
Comparison: 03/13/2019.

CLINICAL DATA: Left-sided numbness 03/13/2019

EXAM:
MRI CERVICAL SPINE WITHOUT AND WITH CONTRAST
TECHNIQUE: Multiplanar and multiecho pulse sequences of the cervical spine, to
include the craniocervical junction and cervicothoracic junction,
were obtained without and with intravenous contrast.
CONTRAST:  18mL MULTIHANCE GADOBENATE DIMEGLUMINE 529 MG/ML IV SOLN

[Series 2: T1 · sagittal · 3.0mm · 0.82mm/px · 4 of 15 slices shown (1 of 2)]
[im 1/15]
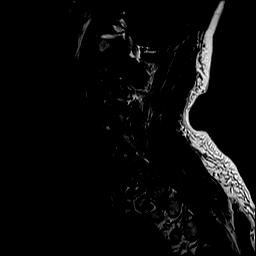
[im 5/15]
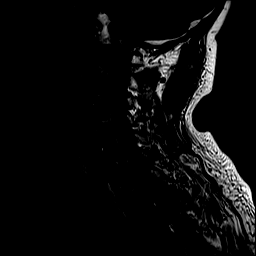
[im 10/15]
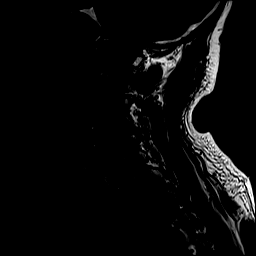
[im 15/15]
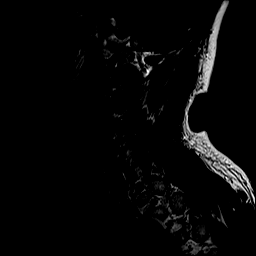

[Series 3: STIR · sagittal · 3.0mm · 0.82mm/px · 4 of 15 slices shown]
[im 1/15]
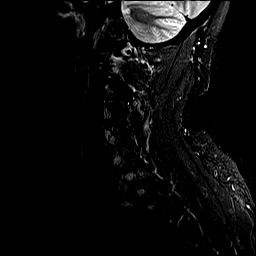
[im 5/15]
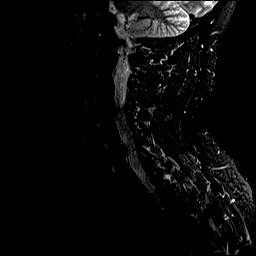
[im 10/15]
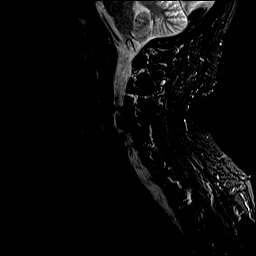
[im 15/15]
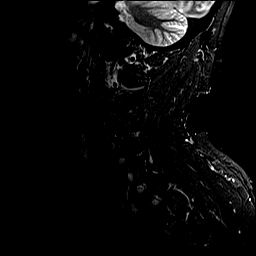

[Series 4: T2 · axial · 3.0mm · 0.70mm/px · z∈[-61,+26]mm · 8 of 25 slices shown (1 of 2)]
[im 1/25]
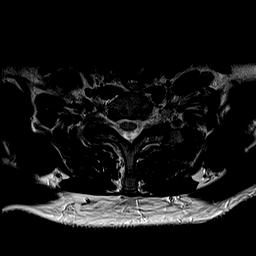
[im 4/25]
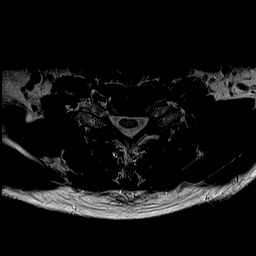
[im 7/25]
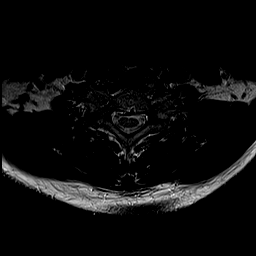
[im 11/25]
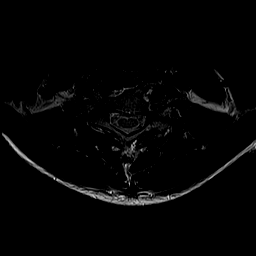
[im 14/25]
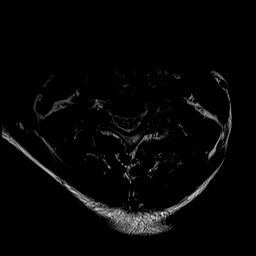
[im 18/25]
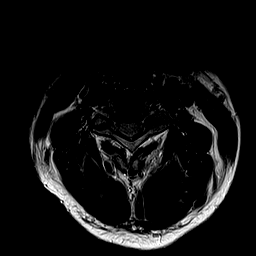
[im 21/25]
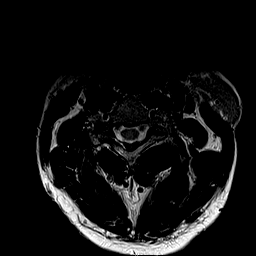
[im 25/25]
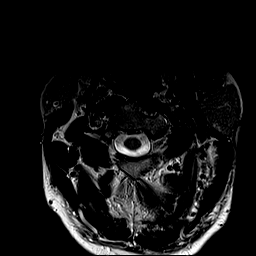

[Series 6: T1 · axial · 3.0mm · 0.35mm/px · z∈[-61,+26]mm · 8 of 25 slices shown (2 of 2)]
[im 1/25]
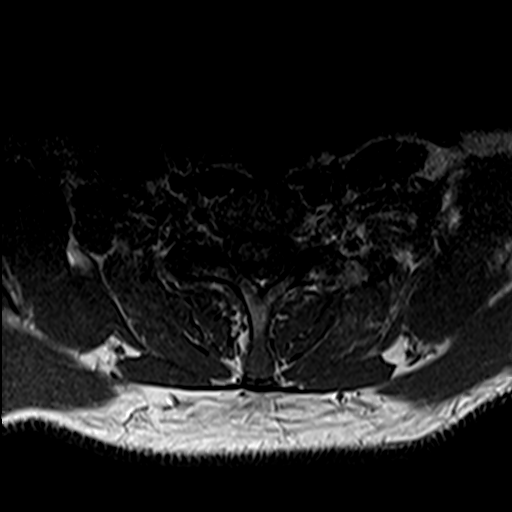
[im 4/25]
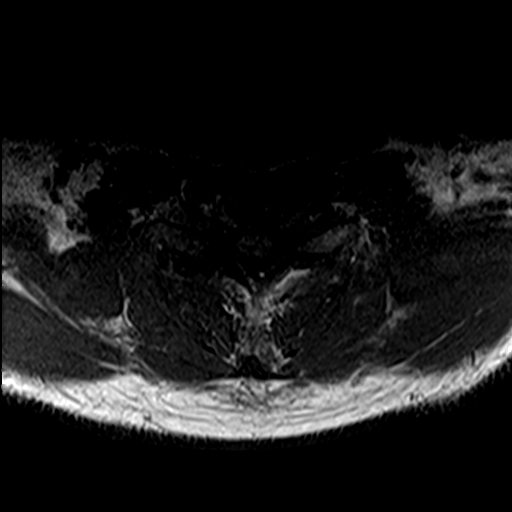
[im 7/25]
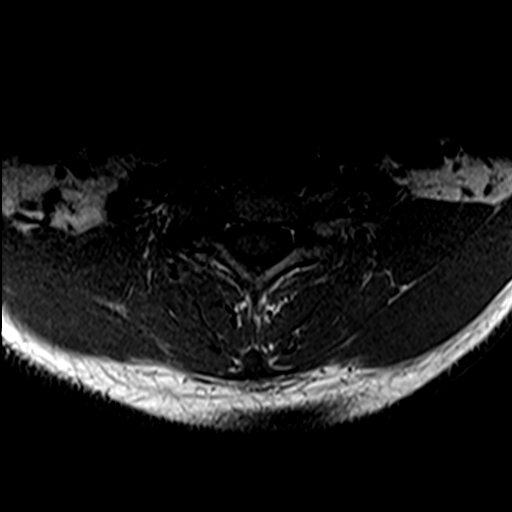
[im 11/25]
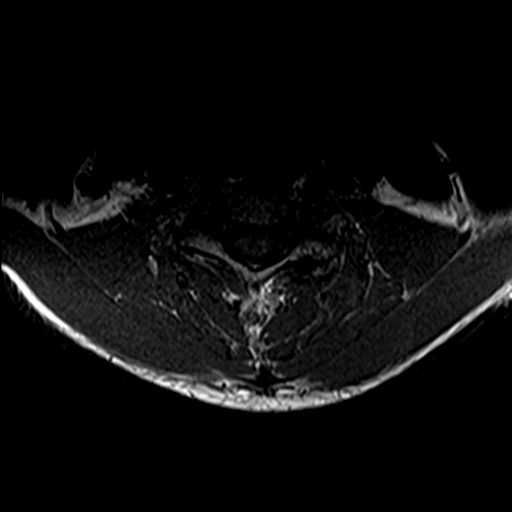
[im 14/25]
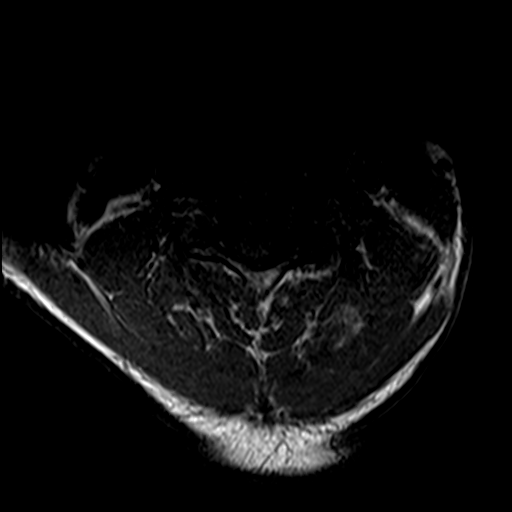
[im 18/25]
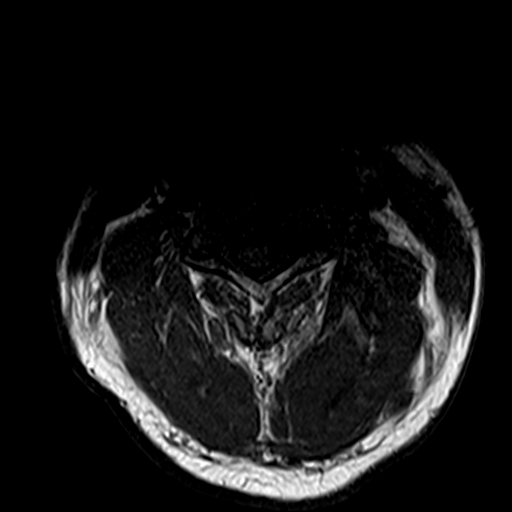
[im 21/25]
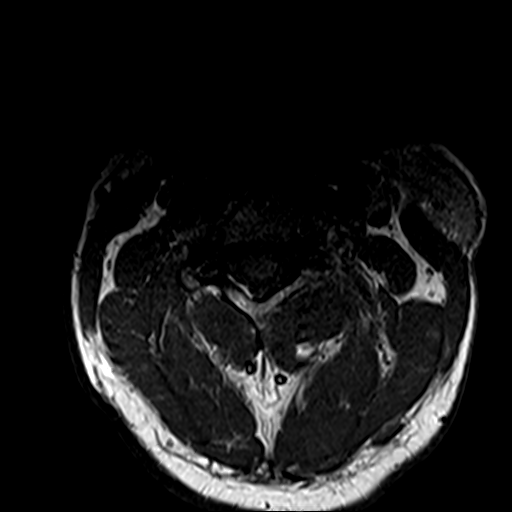
[im 25/25]
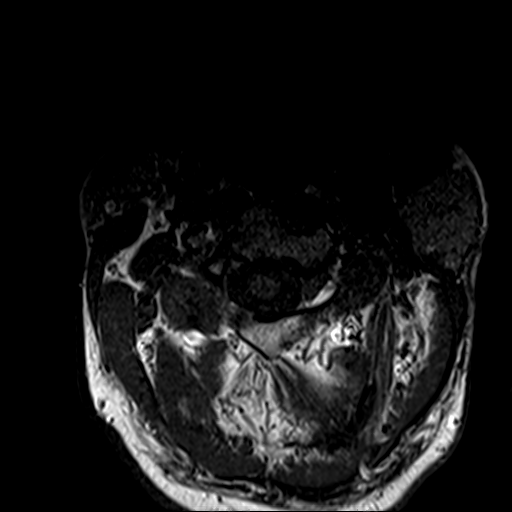

[Series 7: T2 · sagittal · 3.0mm · 0.41mm/px · 4 of 15 slices shown (2 of 2)]
[im 1/15]
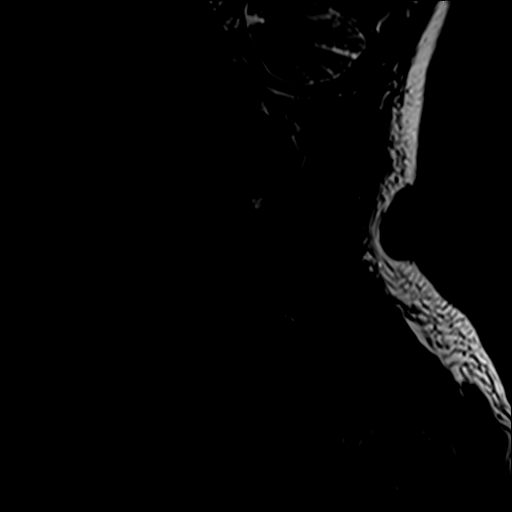
[im 5/15]
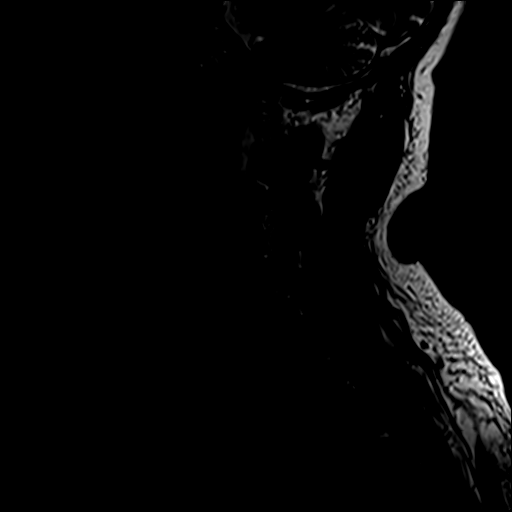
[im 10/15]
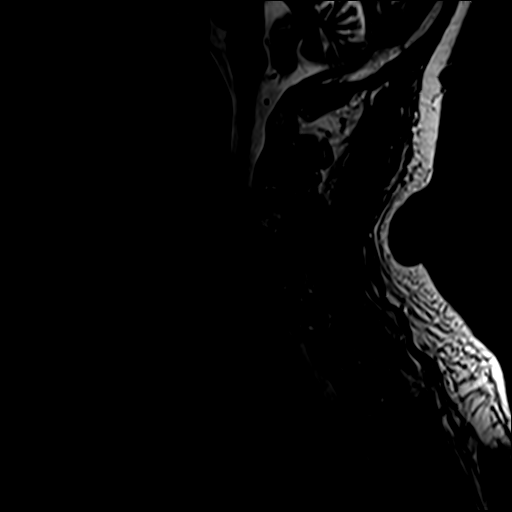
[im 15/15]
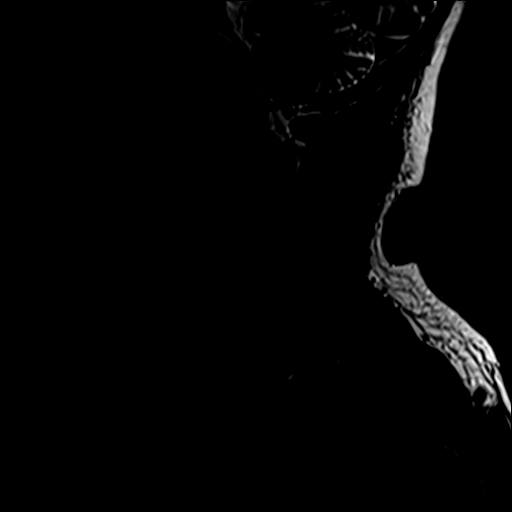

[Series 8: T1 fat-sat post-contrast · sagittal · 3.0mm · 0.82mm/px · 4 of 15 slices shown]
[im 1/15]
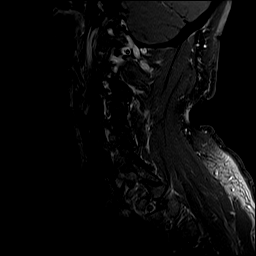
[im 5/15]
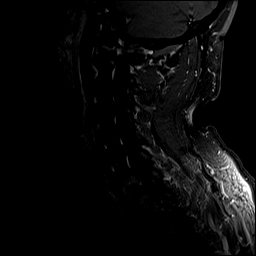
[im 10/15]
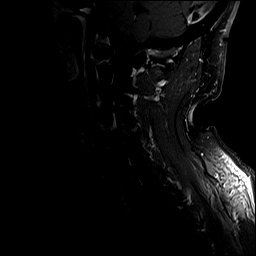
[im 15/15]
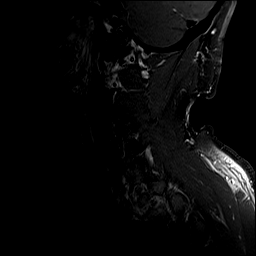

[32 of 48 positions shown; findings below may reference images not displayed]

FINDINGS: Alignment: Unchanged trace retrolisthesis of C3 on C4 and C5 on C6.

Vertebrae: No acute fracture or suspicious osseous lesion. No
abnormal osseous enhancement. Redemonstrated hemangioma at T2.

Cord: Normal in caliber and signal. No abnormal T2 hyperintense
signal or contrast enhancement.

Posterior Fossa, vertebral arteries, paraspinal tissues: Negative.

Disc levels:

C2-C3: No significant disc bulge. Right-greater-than-left
uncovertebral and facet arthropathy. No spinal canal stenosis.
Moderate right neural foraminal narrowing, unchanged.

C3-C4: Trace retrolisthesis with mild disc bulge. Uncovertebral and
facet hypertrophy. Mild spinal canal stenosis. Moderate bilateral
neural foraminal narrowing, unchanged.

C4-C5: No significant disc bulge. No spinal canal stenosis.
Left-greater-than-right facet arthropathy. Moderate left neural
foraminal narrowing, unchanged.

C5-C6: Mild disc bulge. No spinal canal stenosis. Uncovertebral and
facet arthropathy. Mild left and moderate right neural foraminal
narrowing, unchanged.

C6-C7: No significant disc bulge. No spinal canal stenosis or
neuroforaminal narrowing.

C7-T1: No significant disc bulge. No spinal canal stenosis or
neuroforaminal narrowing.
IMPRESSION: 1. C3-C4 mild spinal canal stenosis and moderate bilateral neural
foraminal narrowing.
2. No other spinal canal stenosis. Moderate neural foraminal
narrowing on the right at C2-C3, bilaterally at C3-C4, on the left
at C4-C5, and on the right at C5-C6, overall unchanged from the
prior exam.
3. No abnormal cord signal or enhancement.

## 2024-02-14 ENCOUNTER — Ambulatory Visit

## 2024-02-14 VITALS — BP 120/69 | HR 79

## 2024-02-14 DIAGNOSIS — Z0130 Encounter for examination of blood pressure without abnormal findings: Secondary | ICD-10-CM

## 2024-02-15 NOTE — Progress Notes (Signed)
 Not seen by provider. BP taken by CMA.
